# Patient Record
Sex: Male | Born: 1950 | Race: Black or African American | Hispanic: No | Marital: Married | Smoking: Former smoker
Health system: Southern US, Community
[De-identification: ages and names within clinical notes are randomized; demographics above are authoritative.]

## PROBLEM LIST (undated history)

## (undated) DIAGNOSIS — K589 Irritable bowel syndrome without diarrhea: Secondary | ICD-10-CM

## (undated) DIAGNOSIS — C801 Malignant (primary) neoplasm, unspecified: Secondary | ICD-10-CM

## (undated) DIAGNOSIS — H409 Unspecified glaucoma: Secondary | ICD-10-CM

## (undated) DIAGNOSIS — M1990 Unspecified osteoarthritis, unspecified site: Secondary | ICD-10-CM

## (undated) DIAGNOSIS — I739 Peripheral vascular disease, unspecified: Secondary | ICD-10-CM

## (undated) DIAGNOSIS — E119 Type 2 diabetes mellitus without complications: Secondary | ICD-10-CM

## (undated) DIAGNOSIS — I10 Essential (primary) hypertension: Secondary | ICD-10-CM

## (undated) HISTORY — DX: Unspecified osteoarthritis, unspecified site: M19.90

## (undated) HISTORY — DX: Peripheral vascular disease, unspecified: I73.9

## (undated) HISTORY — DX: Essential (primary) hypertension: I10

## (undated) HISTORY — DX: Type 2 diabetes mellitus without complications: E11.9

## (undated) HISTORY — DX: Irritable bowel syndrome without diarrhea: K58.9

## (undated) HISTORY — DX: Unspecified glaucoma: H40.9

---

## 1998-02-26 ENCOUNTER — Ambulatory Visit (HOSPITAL_COMMUNITY): Admission: RE | Admit: 1998-02-26 | Discharge: 1998-02-26 | Payer: Self-pay | Admitting: Neurological Surgery

## 1998-05-02 ENCOUNTER — Ambulatory Visit (HOSPITAL_COMMUNITY): Admission: RE | Admit: 1998-05-02 | Discharge: 1998-05-02 | Payer: Self-pay | Admitting: Gastroenterology

## 1998-05-28 ENCOUNTER — Ambulatory Visit (HOSPITAL_COMMUNITY): Admission: RE | Admit: 1998-05-28 | Discharge: 1998-05-28 | Payer: Self-pay | Admitting: Neurological Surgery

## 1998-05-28 ENCOUNTER — Encounter: Payer: Self-pay | Admitting: Neurological Surgery

## 1998-09-21 ENCOUNTER — Ambulatory Visit (HOSPITAL_COMMUNITY): Admission: RE | Admit: 1998-09-21 | Discharge: 1998-09-21 | Payer: Self-pay | Admitting: Neurological Surgery

## 1998-09-21 ENCOUNTER — Encounter: Payer: Self-pay | Admitting: Neurological Surgery

## 1998-11-14 ENCOUNTER — Ambulatory Visit (HOSPITAL_COMMUNITY): Admission: RE | Admit: 1998-11-14 | Discharge: 1998-11-14 | Payer: Self-pay | Admitting: Neurological Surgery

## 1998-11-14 ENCOUNTER — Encounter: Payer: Self-pay | Admitting: Neurological Surgery

## 1998-12-28 ENCOUNTER — Encounter: Payer: Self-pay | Admitting: Neurosurgery

## 1999-01-01 ENCOUNTER — Encounter: Payer: Self-pay | Admitting: Neurological Surgery

## 1999-01-01 ENCOUNTER — Inpatient Hospital Stay (HOSPITAL_COMMUNITY): Admission: RE | Admit: 1999-01-01 | Discharge: 1999-01-05 | Payer: Self-pay | Admitting: Neurological Surgery

## 1999-04-18 ENCOUNTER — Ambulatory Visit (HOSPITAL_COMMUNITY): Admission: RE | Admit: 1999-04-18 | Discharge: 1999-04-18 | Payer: Self-pay | Admitting: Gastroenterology

## 1999-11-07 ENCOUNTER — Encounter: Payer: Self-pay | Admitting: Neurological Surgery

## 1999-11-07 ENCOUNTER — Encounter: Admission: RE | Admit: 1999-11-07 | Discharge: 1999-11-07 | Payer: Self-pay | Admitting: Neurological Surgery

## 2000-03-11 ENCOUNTER — Encounter: Payer: Self-pay | Admitting: Neurological Surgery

## 2000-03-11 ENCOUNTER — Encounter: Admission: RE | Admit: 2000-03-11 | Discharge: 2000-03-11 | Payer: Self-pay | Admitting: Neurological Surgery

## 2000-05-28 ENCOUNTER — Ambulatory Visit (HOSPITAL_BASED_OUTPATIENT_CLINIC_OR_DEPARTMENT_OTHER): Admission: RE | Admit: 2000-05-28 | Discharge: 2000-05-28 | Payer: Self-pay | Admitting: Ophthalmology

## 2002-09-11 ENCOUNTER — Emergency Department (HOSPITAL_COMMUNITY): Admission: EM | Admit: 2002-09-11 | Discharge: 2002-09-11 | Payer: Self-pay | Admitting: Emergency Medicine

## 2002-09-11 ENCOUNTER — Encounter: Payer: Self-pay | Admitting: Emergency Medicine

## 2002-09-14 ENCOUNTER — Ambulatory Visit (HOSPITAL_COMMUNITY): Admission: RE | Admit: 2002-09-14 | Discharge: 2002-09-14 | Payer: Self-pay | Admitting: Family Medicine

## 2003-01-24 ENCOUNTER — Encounter: Payer: Self-pay | Admitting: Family Medicine

## 2003-01-24 ENCOUNTER — Ambulatory Visit (HOSPITAL_COMMUNITY): Admission: RE | Admit: 2003-01-24 | Discharge: 2003-01-24 | Payer: Self-pay | Admitting: Family Medicine

## 2003-01-27 ENCOUNTER — Encounter: Payer: Self-pay | Admitting: Family Medicine

## 2003-01-27 ENCOUNTER — Inpatient Hospital Stay (HOSPITAL_COMMUNITY): Admission: EM | Admit: 2003-01-27 | Discharge: 2003-02-04 | Payer: Self-pay | Admitting: Family Medicine

## 2003-02-01 ENCOUNTER — Encounter: Payer: Self-pay | Admitting: Family Medicine

## 2003-05-01 ENCOUNTER — Ambulatory Visit (HOSPITAL_COMMUNITY): Admission: RE | Admit: 2003-05-01 | Discharge: 2003-05-01 | Payer: Self-pay | Admitting: Family Medicine

## 2003-05-01 ENCOUNTER — Encounter: Payer: Self-pay | Admitting: Family Medicine

## 2003-05-16 ENCOUNTER — Encounter: Payer: Self-pay | Admitting: Neurological Surgery

## 2003-05-16 ENCOUNTER — Ambulatory Visit (HOSPITAL_COMMUNITY): Admission: RE | Admit: 2003-05-16 | Discharge: 2003-05-16 | Payer: Self-pay | Admitting: Neurological Surgery

## 2003-06-12 ENCOUNTER — Encounter: Admission: RE | Admit: 2003-06-12 | Discharge: 2003-06-12 | Payer: Self-pay | Admitting: Neurological Surgery

## 2003-06-12 ENCOUNTER — Encounter: Payer: Self-pay | Admitting: Neurological Surgery

## 2003-09-02 HISTORY — PX: BACK SURGERY: SHX140

## 2003-11-07 ENCOUNTER — Emergency Department (HOSPITAL_COMMUNITY): Admission: EM | Admit: 2003-11-07 | Discharge: 2003-11-07 | Payer: Self-pay | Admitting: Emergency Medicine

## 2004-07-18 ENCOUNTER — Ambulatory Visit (HOSPITAL_COMMUNITY): Admission: RE | Admit: 2004-07-18 | Discharge: 2004-07-18 | Payer: Self-pay | Admitting: Neurological Surgery

## 2004-09-05 ENCOUNTER — Encounter (HOSPITAL_COMMUNITY): Admission: RE | Admit: 2004-09-05 | Discharge: 2004-10-05 | Payer: Self-pay | Admitting: Neurological Surgery

## 2004-12-23 ENCOUNTER — Inpatient Hospital Stay (HOSPITAL_COMMUNITY): Admission: RE | Admit: 2004-12-23 | Discharge: 2004-12-24 | Payer: Self-pay | Admitting: Neurological Surgery

## 2005-01-08 ENCOUNTER — Encounter (INDEPENDENT_AMBULATORY_CARE_PROVIDER_SITE_OTHER): Payer: Self-pay | Admitting: Specialist

## 2005-01-08 ENCOUNTER — Ambulatory Visit (HOSPITAL_COMMUNITY): Admission: RE | Admit: 2005-01-08 | Discharge: 2005-01-08 | Payer: Self-pay | Admitting: Gastroenterology

## 2005-05-29 ENCOUNTER — Inpatient Hospital Stay (HOSPITAL_COMMUNITY): Admission: RE | Admit: 2005-05-29 | Discharge: 2005-05-31 | Payer: Self-pay | Admitting: Gastroenterology

## 2005-06-03 ENCOUNTER — Ambulatory Visit (HOSPITAL_COMMUNITY): Admission: RE | Admit: 2005-06-03 | Discharge: 2005-06-03 | Payer: Self-pay | Admitting: Family Medicine

## 2006-12-16 ENCOUNTER — Ambulatory Visit (HOSPITAL_BASED_OUTPATIENT_CLINIC_OR_DEPARTMENT_OTHER): Admission: RE | Admit: 2006-12-16 | Discharge: 2006-12-16 | Payer: Self-pay | Admitting: Ophthalmology

## 2007-02-24 ENCOUNTER — Ambulatory Visit (HOSPITAL_COMMUNITY): Admission: RE | Admit: 2007-02-24 | Discharge: 2007-02-24 | Payer: Self-pay | Admitting: Gastroenterology

## 2007-03-09 ENCOUNTER — Ambulatory Visit (HOSPITAL_COMMUNITY): Admission: RE | Admit: 2007-03-09 | Discharge: 2007-03-09 | Payer: Self-pay | Admitting: Gastroenterology

## 2007-04-29 ENCOUNTER — Encounter: Admission: RE | Admit: 2007-04-29 | Discharge: 2007-04-29 | Payer: Self-pay | Admitting: Family Medicine

## 2007-08-11 ENCOUNTER — Encounter: Admission: RE | Admit: 2007-08-11 | Discharge: 2007-08-11 | Payer: Self-pay | Admitting: Family Medicine

## 2007-12-13 ENCOUNTER — Ambulatory Visit (HOSPITAL_COMMUNITY): Admission: RE | Admit: 2007-12-13 | Discharge: 2007-12-13 | Payer: Self-pay | Admitting: Family Medicine

## 2008-02-24 ENCOUNTER — Ambulatory Visit: Payer: Self-pay | Admitting: *Deleted

## 2008-03-09 ENCOUNTER — Ambulatory Visit: Payer: Self-pay | Admitting: Surgery

## 2008-03-09 ENCOUNTER — Ambulatory Visit (HOSPITAL_COMMUNITY): Admission: RE | Admit: 2008-03-09 | Discharge: 2008-03-09 | Payer: Self-pay | Admitting: Surgery

## 2008-03-23 ENCOUNTER — Ambulatory Visit: Payer: Self-pay | Admitting: *Deleted

## 2008-04-07 ENCOUNTER — Inpatient Hospital Stay (HOSPITAL_COMMUNITY): Admission: RE | Admit: 2008-04-07 | Discharge: 2008-04-14 | Payer: Self-pay | Admitting: *Deleted

## 2008-04-07 ENCOUNTER — Ambulatory Visit: Payer: Self-pay | Admitting: *Deleted

## 2008-04-07 DIAGNOSIS — M1990 Unspecified osteoarthritis, unspecified site: Secondary | ICD-10-CM

## 2008-04-07 DIAGNOSIS — K589 Irritable bowel syndrome without diarrhea: Secondary | ICD-10-CM

## 2008-04-07 HISTORY — PX: FEMORAL-TIBIAL BYPASS GRAFT: SHX938

## 2008-04-07 HISTORY — DX: Irritable bowel syndrome without diarrhea: K58.9

## 2008-04-07 HISTORY — DX: Unspecified osteoarthritis, unspecified site: M19.90

## 2008-04-08 ENCOUNTER — Encounter (INDEPENDENT_AMBULATORY_CARE_PROVIDER_SITE_OTHER): Payer: Self-pay | Admitting: *Deleted

## 2008-04-21 ENCOUNTER — Ambulatory Visit: Payer: Self-pay | Admitting: Vascular Surgery

## 2008-05-04 ENCOUNTER — Ambulatory Visit: Payer: Self-pay | Admitting: *Deleted

## 2008-08-03 ENCOUNTER — Ambulatory Visit: Payer: Self-pay | Admitting: *Deleted

## 2008-12-06 ENCOUNTER — Ambulatory Visit: Payer: Self-pay | Admitting: *Deleted

## 2009-04-03 ENCOUNTER — Ambulatory Visit: Payer: Self-pay | Admitting: *Deleted

## 2009-12-07 ENCOUNTER — Emergency Department (HOSPITAL_COMMUNITY): Admission: EM | Admit: 2009-12-07 | Discharge: 2009-12-07 | Payer: Self-pay | Admitting: Emergency Medicine

## 2010-02-13 ENCOUNTER — Encounter (HOSPITAL_COMMUNITY): Admission: RE | Admit: 2010-02-13 | Discharge: 2010-03-15 | Payer: Self-pay | Admitting: Family Medicine

## 2010-03-01 ENCOUNTER — Ambulatory Visit (HOSPITAL_COMMUNITY): Admission: RE | Admit: 2010-03-01 | Discharge: 2010-03-01 | Payer: Self-pay | Admitting: Family Medicine

## 2010-09-22 ENCOUNTER — Encounter: Payer: Self-pay | Admitting: Gastroenterology

## 2011-01-11 HISTORY — PX: COLONOSCOPY: SHX174

## 2011-01-14 NOTE — Procedures (Signed)
BYPASS GRAFT EVALUATION   INDICATION:  Followup evaluation of lower extremity bypass graft.  The  patient complains of right leg numbness and left leg burning.   HISTORY:  Diabetes:  Yes.  Cardiac:  No.  Hypertension:  Yes.  Smoking:  The patient currently smokes.  Previous Surgery:  Left superficial femoral artery to anterior tibial  artery bypass graft with nonreverse translocated saphenous vein on  04/07/2008.   SINGLE LEVEL ARTERIAL EXAM                               RIGHT              LEFT  Brachial:                    139                140  Anterior tibial:             137                86  Posterior tibial:            119                88  Peroneal:  Ankle/brachial index:        0.98               0.63   PREVIOUS ABI:  Date:  04/08/2008  RIGHT:  1.0  LEFT:  0.80   LOWER EXTREMITY BYPASS GRAFT DUPLEX EXAM:   DUPLEX:  Due to the drop in the left ABI and pain in the left leg the  decision was made to duplex the bypass graft.  Doppler arterial  waveforms are monophasic proximal to, within and distal to the left  superficial femoral artery to anterior tibial artery bypass graft with  no evidence of vein graft stenosis.   IMPRESSION:  1. Right ABI is stable from postop study.  2. Left ABI is lower than previously recorded.  3. The superficial femoral artery to anterior tibial artery bypass      graft is patent with no evidence of vein graft stenosis.   ___________________________________________  P. Liliane Bade, M.D.   MC/MEDQ  D:  05/05/2008  T:  05/05/2008  Job:  [id number]

## 2011-01-14 NOTE — Procedures (Signed)
BYPASS GRAFT EVALUATION   INDICATION:  Followup lower extremity bypass graft.   HISTORY:  Diabetes:  Yes.  Cardiac:  No.  Hypertension:  Yes.  Smoking:  Yes.  Previous Surgery:  Left SFA to ATA bypass graft with nonreversed  translocated saphenous vein on 04/07/2008 by Dr. Madilyn Fireman.   SINGLE LEVEL ARTERIAL EXAM                               RIGHT              LEFT  Brachial:                    144                144  Anterior tibial:             133                117  Posterior tibial:            111                101  Peroneal:  Ankle/brachial index:        0.92               0.81   PREVIOUS ABI:  Date:  08/03/2008  RIGHT:  0.94  LEFT:  0.78   LOWER EXTREMITY BYPASS GRAFT DUPLEX EXAM:   DUPLEX:  Patent SFA to ATA bypass graft with no evidence of focal  stenosis.  Biphasic duplex waveform noted within graft and native arteries.   IMPRESSION:  1. Right lower extremity ABI suggests mild arterial disease with      biphasic and monophasic Doppler waveforms.  2. Left lower extremity ABI suggests moderate arterial disease with      biphasic and monophasic Doppler waveforms.   ___________________________________________  P. Liliane Bade, M.D.   AC/MEDQ  D:  12/06/2008  T:  12/06/2008  Job:  [id number]

## 2011-01-14 NOTE — Consult Note (Signed)
VASCULAR SURGERY CONSULTATION   [name], [name]  DOB:  01/21/1951                                       02/24/2008  ZOXWR#:[id number]   REFERRING PHYSICIAN:  Renaye [name], M.D.   CHIEF COMPLAINT:  Left lower extremity pain.   HISTORY:  The patient is a 60 year old gentleman who notes left calf  discomfort with ambulation.  This occurs at less than one block.  There  is relief readily with rest.  He denies night pain or rest pain.  The  pain is made worse by brisk walking or up on incline.  No pain in the  right lower extremity.  No history of nonhealing lesions.   PAST MEDICAL HISTORY:  1. Type 2 diabetes.  2. Hypertension.  3. Degenerative disk disease.  4. Irritable bowel syndrome.  5. Glaucoma.   MEDICATIONS:  1. Plavix 75 mg daily.  2. Azor 10/40 one tablet daily.  3. Viagra p.r.n.  4. Bupropion SR 150 mg tablet b.i.d.  5. Bystolic 10 mg daily.  6. Tekturna HCT 300/25 one tablet daily.  7. Timolol 1 drop each eye daily.  8. Brimonidine 0.2% 1 drop each eye t.i.d.   ALLERGIES:  None known.   FAMILY HISTORY:  Mother died age 31 with a history of congestive heart  failure, diabetes and coronary artery disease.  Father died age 45 with  a history of coronary artery disease and had undergone bypass surgery.  He has several siblings with a history of diabetes and strokes.   SOCIAL HISTORY:  The patient is married and two children.  He is on long  term disability.  Smokes heavily up to two packs of cigarettes per day  but has now reduced this to 6 cigarettes daily.  No alcohol use.   REVIEW OF SYSTEMS:  The patient notes abdominal pain related to  irritable bowel syndrome.  Muscle pain and arthritis.  Further systems  negative per patient encounter form.   PHYSICAL EXAMINATION:  General:  Alert 60 year old [place]  male.  No acute distress.  Vital signs:  BP 120/72, pulse 81 per minute,  respirations 16 per minute.  HEENT:  Mouth and throat  are clear.  Normocephalic.  Extraocular movements intact.  Neck:  Supple.  No  thyromegaly or adenopathy.  Chest:  Equal air entry bilaterally.  No  rales or rhonchi.  Cardiovascular:  No carotid bruits.  Normal heart  sounds without murmurs.  Regular rate and rhythm.  No gallops or rubs.  Abdomen:  Soft, nontender.  No masses or organomegaly.  Normal bowel  sounds, no bruits.  Lower extremities:  No femoral bruits.  2+ femoral  pulses bilaterally.  1+ right popliteal and dorsalis pedis pulse.  Absent left popliteal, dorsalis pedis and posterior tibial pulses.   INVESTIGATIONS:  Lower extremity Doppler reveals left ABI of 0.73 and  right ABI of 0.40.  Waveforms consistent with inflow disease bilaterally  and bilateral superficial femoral artery occlusive disease.   IMPRESSION:  1. Left lower extremity claudication.  2. Type 2 diabetes.  3. Hypertension.  4. Irritable bowel syndrome.  5. Glaucoma.  6. Tobacco abuse.   RECOMMENDATIONS:  The patient will be scheduled to undergo lower  extremity arteriography with possible intervention for treatment of his  lower extremity claudication.  This will be scheduled at [facility] as  an outpatient.   [name], M.D.  Electronically Signed  PGH/MEDQ  D:  02/24/2008  T:  02/25/2008  Job:  [id number]   cc:   [name], M.D.

## 2011-01-14 NOTE — Procedures (Signed)
VASCULAR LAB EXAM   INDICATION:  Preop exam.   HISTORY:  Diabetes:  Yes.  Cardiac:  Yes.  Hypertension:  Yes.   EXAM:  Bilateral lower extremity vein mapping.   IMPRESSION:  1. The right greater saphenous pain is compressible with diameter      measurements ranging from 0.28 to 0.45 cm.  2. The right lesser saphenous vein is compressible with diameter      measurements ranging from 0.25 to 0.35 cm.  3. The left greater saphenous vein is compressible with diameter      measurements ranging from 0.16 to 0.3 cm.  4. The left lesser saphenous vein was not visualized.  5. Additional measurements are noted on the attached work sheet.   ___________________________________________  P. Liliane Bade, M.D.   CH/MEDQ  D:  03/23/2008  T:  03/23/2008  Job:  [id number]

## 2011-01-14 NOTE — H&P (Signed)
[name], [name]              ACCOUNT NO.:  [id number]   MEDICAL RECORD NO.:  [id number]          PATIENT TYPE:  INP   LOCATION:  3304                         FACILITY:  [facility]   PHYSICIAN:  Balinda Quails, M.D.    DATE OF BIRTH:  [birth year]   DATE OF ADMISSION:  04/07/2008  DATE OF DISCHARGE:                              HISTORY & PHYSICAL   ADMITTING PHYSICIAN:  Balinda Quails, MD   PRIMARY CARE PHYSICIAN:  Renaye Rakers, MD   ADMISSION DIAGNOSIS:  Ischemic left foot.   HISTORY:  [name] is a 60 year old gentleman with a history of  severe claudication.  Now, he has developed rest pain in his left foot.  The pain has continued to worsen over the past several weeks.  No  history of nonhealing ulceration or gangrene.  He does have underlying  type 2 diabetes.   An arteriogram was performed and this reveals distal left superficial  femoral and popliteal occlusion and severe tibial disease with dominant  runoff anterior tibial artery.   PAST MEDICAL HISTORY:  1. Type 2 diabetes.  2. Hypertension.  3. Degenerative disk disease.  4. Irritable bowel syndrome.  5. Glaucoma.   MEDICATIONS:  1. Plavix 75 mg daily.  2. Azor 10/40 one tablet daily.  3. Bystolic 10 mg daily.  4. Bupropion SR 150 mg b.i.d.  5. Viagra p.r.n.  6. Tekturna HCT 300/25 one tablet daily.  7. Atenolol 0.5% one drop each eye daily.  8. Brimonidine 0.2% one drop each eye t.i.d.  9. Darvocet-N 100 1 to 2 tablets q.4 h. p.r.n.   ALLERGIES:  None known.   FAMILY HISTORY:  Mother died at age 48 with a history of congestive  heart failure, diabetes, and coronary disease.  Father died at age 66  with a history of coronary disease and had undergone bypass surgery.  He  has several siblings with a history of diabetes and strokes.   SOCIAL HISTORY:  The patient is married with two children.  He is on  long-term disability.  Smokes heavily up to 2 packs of cigarettes daily,  but has now reduced this to  approximately 6 cigarettes daily.  No  alcohol use.   REVIEW OF SYSTEMS:  The patient notes abdominal pain related to  irritable bowel syndrome.  Muscle pain and arthritis.   PHYSICAL EXAMINATION:  GENERAL:  Alert 60 year old [place]  male.  In no acute distress.  VITAL SIGNS:  BP 120/72, pulse 81 per minute, and respirations 16 per  minute.  HEENT:  Mouth and throat are clear.  Normocephalic.  Extraocular  movements are intact.  NECK:  Supple.  No thyromegaly or adenopathy.  CHEST:  Equal air entry bilaterally, no rales or rhonchi.  CARDIOVASCULAR:  No carotid bruits.  Normal heart sounds without  murmurs.  Regular rate and rhythm.  No gallops or rubs.  ABDOMEN:  Soft, nontender.  No masses or organomegaly.  Normal bowel  sounds without bruits.  LOWER EXTREMITIES:  No femoral bruits.  2+ femoral pulses bilaterally.  1+ right popliteal and dorsalis pedis pulse.  Absent left  popliteal,  posterior tibial, and dorsalis pedis pulses.  Left foot is cool.  No  ulceration or gangrene.   ADMISSION DIAGNOSIS:  Ischemic left foot.   SECONDARY DIAGNOSES:  Type 2 diabetes, hypertension, irritable bowel  syndrome, glaucoma, tobacco abuse.   ADMISSION PLAN:  The patient will be admitted electively at [facility] on April 07, 2008, for planned left femoral anterior tibial  bypass for limb salvage.      Balinda Quails, M.D.  Electronically Signed     PGH/MEDQ  D:  04/07/2008  T:  04/08/2008  Job:  [id number]   cc:   Renaye Rakers, M.D.

## 2011-01-14 NOTE — Procedures (Signed)
BYPASS GRAFT EVALUATION   INDICATION:  Follow-up left lower extremity bypass graft.   HISTORY:  Diabetes:  Yes.  Cardiac:  No.  Hypertension:  Yes.  Smoking:  Yes.  Previous Surgery:  Left superficial femoral to anterior tibial artery  bypass graft on 04/07/2008 by Dr. Madilyn Fireman.   SINGLE LEVEL ARTERIAL EXAM                               RIGHT              LEFT  Brachial:                    153                160  Anterior tibial:             152                125  Posterior tibial:            129                125  Peroneal:  Ankle/brachial index:        0.95               0.78   PREVIOUS ABI:  Date: 12/06/2008  RIGHT:  0.92  LEFT:  0.81   LOWER EXTREMITY BYPASS GRAFT DUPLEX EXAM:   DUPLEX:  1. Biphasic Doppler waveforms noted throughout the left lower      extremity bypass graft and its native vessels.  2. Mildly increased velocity of 146 cm/s noted at the left distal      anastomosis region which could be due to change in vessel diameter.   IMPRESSION:  1. Patent left superficial femoral to anterior tibial artery bypass      graft with increased velocity noted, as described above.  2. Stable bilateral ankle brachial indices noted.   ___________________________________________  Di Kindle. Edilia Bo, M.D.   CH/MEDQ  D:  04/03/2009  T:  04/03/2009  Job:  [id number]

## 2011-01-14 NOTE — Op Note (Signed)
NAMEMARVIN, [name]              ACCOUNT NO.:  [id number]   MEDICAL RECORD NO.:  [id number]          PATIENT TYPE:  AMB   LOCATION:  SDS                          FACILITY:  [facility]   PHYSICIAN:  Juleen China IV, MDDATE OF BIRTH:  [birth year]   DATE OF PROCEDURE:  03/09/2008  DATE OF DISCHARGE:  03/09/2008                               OPERATIVE REPORT   PREOPERATIVE DIAGNOSIS:  Left leg claudication.   POSTOPERATIVE DIAGNOSIS:  Left leg claudication.   PROCEDURE PERFORMED:  1. Ultrasound-guided access of right common femoral artery.  2. Abdominal aortogram.  3. Bilateral lower extremity runoff.  4. Second-order catheterization.   PROCEDURE:  The patient identified in the holding area and taken to room  #8.  He was placed supine on the table.  Bilateral groins were prepped  and draped in a standard sterile fashion.  Time-out was called.  Right  common femoral artery was evaluated with ultrasound and found to be  widely patent.  Lidocaine 1% was used for local anesthesia.  The right  common femoral artery was accessed with an 18-gauge needle under  ultrasound guidance.  A Benson wire was advanced in a retrograde fashion  into the aorta under fluoroscopic visualization and a 5-French sheath  was placed.  Next, an Omni Flush catheter was placed at level L1 and  abdominal aortogram was obtained.  Next, the right leg runoff was  performed by injection to the sheath.  Next using, Omni Flush catheter  and Benson wire, the aortic bifurcation was crossed.  The catheter was  placed in the left external iliac artery and the left lower extremity  runoff was obtained.   FINDINGS:  Aortogram:  The visualized portions of the suprarenal  abdominal aorta shows minimal disease.  There are single renal arteries  bilaterally, which are widely patent.  The infrarenal abdominal aorta  shows minimal disease.  Bilateral common iliac arteries are widely  patent.  There is mild diffuse disease within  bilateral external iliac  arteries.   Right Lower Extremity:  The right common femoral artery is widely  patent.  The right superficial femoral artery is patent.  There is mild  disease within the adductor canal.  The profunda femoral artery is  widely patent.  The popliteal artery is patent.  The anterior tibial  artery appears to be dominant runoff on the right; however, limited  evaluation of the right-sided tibial disease was obtained secondary to  the patient's movement.   Left Lower Extremity:  The left common femoral artery is widely patent.  The left profunda femoral artery is widely patent.  The left superficial  femoral artery is patent.  There is mild amount of disease within the  adductor canal.  The popliteal artery has moderate disease.  It is  occluded at the level of the patella.  There are prominent genicular  collaterals in this region.  There is reconstitution of the anterior  tibial artery, which is patent down across the ankle.  The peroneal  artery also reconstitutes and it is patent down to the level of just  above the ankle.  Posterior tibial artery is occluded.   After the above images were obtained, decision was made to terminate the  procedure.  Catheters and wires were removed.  The patient was taken to  the holding area for sheath pull.   IMPRESSION:  1. Mild-to-moderate diffuse disease at bilateral superficial femoral      arteries and the adductor canal.  2. Occlusion of the left popliteal artery and reconstitution of      anterior tibial artery, which is diseased.           ______________________________  V. Charlena Cross, MD  Electronically Signed     VWB/MEDQ  D:  03/09/2008  T:  03/10/2008  Job:  [id number]

## 2011-01-14 NOTE — Op Note (Signed)
[name], [name]              ACCOUNT NO.:  [id number]   MEDICAL RECORD NO.:  [id number]          PATIENT TYPE:  AMB   LOCATION:  ENDO                         FACILITY:  [facility]   PHYSICIAN:  Anselmo Rod, M.D.  DATE OF BIRTH:  12-04-1950   DATE OF PROCEDURE:  02/24/2007  DATE OF DISCHARGE:  02/24/2007                               OPERATIVE REPORT   PROCEDURE:  Esophagogastroduodenoscopy.   PROCEDURE PERFORMED:  Esophagogastroduodenoscopy.   ENDOSCOPIST:  Dr. Anselmo Rod.   INSTRUMENT USED:  Pentax video panendoscope.   INDICATIONS FOR PROCEDURE:  A 60 year old, white male with a history of  abdominal pain especially in the epigastric and periumbilical area  undergoing EGD to rule out peptic ulcer disease, esophagitis, gastritis,  etc.   PREPROCEDURE PREPARATION:  Informed consent was procured from the  patient.  The patient fasted for 8 hours prior to procedure. The risks  and benefits of the procedure were discussed with the patient.   PREPROCEDURE PHYSICAL:  The patient had stable vital signs.  Neck  supple.  Chest clear to auscultation.  S1, S2 regular.  Abdomen soft  with normal bowel sounds.   DESCRIPTION OF PROCEDURE:  The patient was placed in the left lateral  decubitus position and sedated with 60 mcg of Fentanyl and 6 mg of  Versed given intravenously in slow incremental doses. Once the patient  was adequately sedated and maintained on low-flow oxygen and continuous  cardiac monitoring, the Pentax video panendoscope was advanced through  the mouthpiece over the tongue into the esophagus under direct vision.  The entire esophagus appeared widely patent with no evidence of ring,  stricture, mass, esophagitis or Barrett's mucosa.  The scope was then  advanced into the stomach.  A small hiatal hernia was seen on high  retroflexion.  The rest of the gastric mucosa in the proximal small  bowel appeared normal.  There was no obstruction. No ulcers, erosions,  masses or polyps were seen.   IMPRESSION:  Normal EGD except for small hiatal hernia.  No ulcers,  erosions, masses or polyps seen.   RECOMMENDATIONS:  1. Continue proton pump inhibitor.  2. Outpatient follow-up in the next 2 weeks, earlier if necessary.      Anselmo Rod, M.D.  Electronically Signed     JNM/MEDQ  D:  03/02/2007  T:  03/03/2007  Job:  [id number]   cc:   Renaye Rakers, M.D.  Fax: [phone]

## 2011-01-14 NOTE — Op Note (Signed)
[name], [name]              ACCOUNT NO.:  [id number]   MEDICAL RECORD NO.:  [id number]          PATIENT TYPE:  INP   LOCATION:  3304                         FACILITY:  [facility]   PHYSICIAN:  Balinda Quails, M.D.    DATE OF BIRTH:  February 23, 1951   DATE OF PROCEDURE:  04/07/2008  DATE OF DISCHARGE:                               OPERATIVE REPORT   SURGEON:  Balinda Quails, MD   ASSISTANT:  Jerold Coombe, PA.   ANESTHETIC:  General endotracheal.   ANESTHESIOLOGIST:  Maren Beach, MD   PREOPERATIVE DIAGNOSIS:  Ischemic rest pain, left foot.   POSTOPERATIVE DIAGNOSIS:  Ischemic rest pain, left foot.   PROCEDURE:  1. Left superficial femoral artery to anterior tibial bypass with non      reversed translocated saphenous vein graft.  2. Intraoperative arteriogram.   CLINICAL NOTE:  [name] is a 60 year old diabetic male with  advanced peripheral vascular disease.  He has ischemic rest pain in the  left foot.  Previous arteriography has revealed a popliteal occlusion  with reconstitution of the anterior tibial artery as the main runoff  vessel to the left foot.  He is brought to the operating at this time  for planned left femoral-anterior tibial bypass.   OPERATIVE PROCEDURE:  The patient is brought to the operating room in  stable hemodynamic condition.  He is placed under general endotracheal  anesthesia.  Left leg prepped and draped in a sterile fashion.   An oblique skin incision made in left groin.  Dissection carried down  through subcutaneous tissue.  The left femoral vessels were exposed.  Left common femoral artery exposed down to the origin of the superficial  femoral artery.  Left superficial femoral artery was patent.  This was  mobilized proximally and encircled with a vessel loop.   The saphenofemoral junction exposed, and the saphenous vein dissected  out.  Tributaries were ligated with 3-0 silk and divided.  Distal  dissection carried along the  saphenofemoral junction down and a  longitudinal skin incision made throughout the length of the left leg.  Saphenous vein exposed, ligated 3-0 silk, and divided.  Distal  dissection carried down to the popliteal fossa.  The left popliteal  fossa entered medially.  The trifurcation exposed.  The proximal left  anterior tibial artery was patent.  This was dissected out and encircled  with a vessel loop.   The saphenous vein was then harvested from the left leg.  Dilated with  heparin saline solution.  The vein then beveled proximally and distally.  A subsartorial tunnel made between the 2 incisions and a tunneler was  passed through the tunnel.   The femoral vessels controlled clamps.  A longitudinal arteriotomy made  in the distal common femoral artery.  The arteriotomy extended across  the left common femoral artery.  The vein was beveled and anastomosed  end-to-side to the left common femoral artery using running 6-0 Prolene  suture.  The valvulotome was then passed up the vein.  Valves were  divided.  Excellent flow present.  The vein then tunneled in  the non-  reversed fashion down to the popliteal fossa.  The left anterior tibial  artery is controlled proximally this with bulldog clamps.  Longitudinal  arteriotomy made.  The vein beveled and anastomosed end-to-side to the  anterior tibial artery using running 7-0 Prolene suture.  Flushing  carried out.  Clamps were removed.  Excellent flow present.  Adequate  hemostasis obtained.   Intraoperative arteriogram obtained with injection of 20 mL of contrast  solution.  This revealed patent vein with flow through the anterior  tibial artery to the foot.   The patient administered 50 mg of protamine intravenously.  Adequate  hemostasis was obtained.  Sponge and instrument counts correct.   The incisions were then closed with a deep layer of running 2-0 Vicryl  suture and superficial layer of running 3-0 Vicryl suture.  Staples   applied to skin.  Sterile dressings applied.   The patient tolerated the procedure well.  He was transferred to  recovery room in stable condition.      Balinda Quails, M.D.  Electronically Signed     PGH/MEDQ  D:  04/07/2008  T:  04/08/2008  Job:  [id number]

## 2011-01-14 NOTE — Discharge Summary (Signed)
[name], [name]              ACCOUNT NO.:  [id number]   MEDICAL RECORD NO.:  [id number]          PATIENT TYPE:  INP   LOCATION:  2021                         FACILITY:  [facility]   PHYSICIAN:  Balinda Quails, M.D.    DATE OF BIRTH:  1951/01/01   DATE OF ADMISSION:  04/07/2008  DATE OF DISCHARGE:  04/14/2008                               DISCHARGE SUMMARY   ADMISSION DIAGNOSIS:  Ischemic left foot.   DISCHARGE/SECONDARY DIAGNOSES:  Ischemic rest pain, left foot status  post left superficial femoral artery to anterior tibial artery bypass.  1. Mild postoperative hypotension.  2. Postoperative dysuria with negative urinalysis.  3. Underlying hypertension.  4. Diabetes mellitus type 2, diet controlled.  5. Degenerative disk disease.  6. Irritable bowel syndrome.  7. Glaucoma Coma.  8. Seen Dr. Brunilda Payor in the past for believed benign prostatic      hypertrophy.  9. Erectile dysfunction.  10.Ongoing tobacco abuse.   No known drug allergies.   PROCEDURES:  On April 07, 2008, left superficial femoral artery to  anterior tibial artery bypass with nonreverse translocated saphenous  vein graft with intraoperative arteriogram by Dr. Madilyn Fireman.   BRIEF HISTORY:  [name] is a 61 year old black male with diabetes  and advanced peripheral vascular disease.  He has ischemic rest pain in  the left foot.  Previous arteriography revealed popliteal occlusion with  reconstitution of the anterior tibial artery as the main runoff vessels  to the left foot.  He was seen by Dr. Madilyn Fireman who recommended left femoral  to anterior tibial artery bypass.   HOSPITAL COURSE:  [name] was electively admitted to [facility] on April 07, 2008.  He underwent the previously-mentioned  procedure.  Postoperatively, he was initially sent to step-down unit  overnight.  He remained stable in the morning of postoperative day #1  and transferred to telemetry unit 2000 where he remained until  discharge.  He  did have postoperative fevers in the first few days of  surgery, spiking this up to 101.5.  This was felt most likely due to  atelectasis.  Aggressive pulmonary toilet was encouraged.  We also  followed his white count, which was normal.  Fevers had resolved by  discharge.  Because he had also reported some dysuria, we also checked  the urinalysis, which was normal and felt most likely due to  inflammation of his prostate secondary to the Foley catheter.  As the  patient did report he had seen Dr. Brunilda Payor in the past, he was unclear for  the reason, but thought that it was due to BPH.  He was able to void  adequately at discharge and felt that he was emptying his bladder and  denied urinary frequency or urgency or hesitancy.  We also had to adjust  his antihypertensive medications during his hospitalization as his blood  pressure was running in the high 80s to low 100s.  We did hold his  Tekturna/HCT and put parameters on his other antihypertensives.  His  postoperative ABIs were 1.0 on the right and 0.8 on the left.  He  maintained good Doppler signals.  His incisions appeared to be healing  well without signs of infection.  He did have small amount of  serosanguineous at his above the knee incision site.  Again, the  incision did not look infected and we prescribed a dry dressing changes  daily as needed and instructed to call if any purulent drainage or  erythema developed.  Of note, [name] did undergo tobacco cessation  consultation during his hospitalization and says that he plans to quit  smoking.  On April 14, 2008, [name] was felt appropriate for  discharge.  He reported that he was more comfortable.  His foot remained  well perfused and vitals were stable showing blood pressure 107/64,  temperature 98.3, pulse 72, in sinus rhythm, and room air oxygenation  96%.  We did place him on a NovoLog insulin sliding scale for his  diabetes and sugars had good control over the left  24 hours, ranging  between 100 and 120.  His hemoglobin A1c this admission was 5.6.  Other  labs as of April 12, 2008, showed a white count of 6.1, hemoglobin 9.4,  hematocrit 27.6, and platelet count 202.  Sodium 134, potassium 4.0,  chloride 102, CO2 25, blood glucose 107, BUN 15, creatinine 0.95, and  calcium 9.8.  His preoperative liver function tests were normal and  again his urinalysis on April 10, 2008, was negative.  At the time of  dictation, [name] remained stable and improved at time of this  dictation.   DISCHARGE MEDICATIONS:  1. Discharged with Tylox 1-2 tablets p.o. q.4 h. p.r.n.  2. Bystolic 10 mg p.o. daily.  3. Plavix 75 mg p.o. daily.  4. Aspirin 81 mg p.o. daily.  5. Wellbutrin 150 mg SR 1 tablet p.o. b.i.d.  6. Timoptic 0.5% ophthalmic solution in both eyes daily.  7. He is instructed to hold his Tekturna/HCT 300 mg/25 mg p.o. daily      until his followup with primary physician.  8. Alphagan 0.2% ophthalmic solution both eyes b.i.d.  9. Bentyl 10 mg p.o. q.i.d.  10.Ocuflox 0.3% ophthalmic solution in both eyes q.i.d.  11.Azor 10/40 mg p.o. daily.  12.He was instructed to hold his home Darvocet while taking Tylox for      pain.   DISCHARGE INSTRUCTIONS:  He is to continue a diabetic-appropriate diet.  He may shower and clean incisions gently with soap and water.  Call if  he develops fever greater than 101 or signs of infection of his wound  site or increased pain.  He should avoid driving or heavy lifting for  the next 2-3 weeks and increase activities slowly.  He will need a  staple removal appointment in our office in 1 week and see Dr. Madilyn Fireman in  approximately 3 weeks with ABIs.  Our office will also contact him  regarding specific appointment date and time.  He was instructed to make  an appointment with his family physician, Dr. Parke Simmers within the next  month to reevaluate his blood pressure and antihypertensive regimen.      Jerold Coombe,  P.A.      Balinda Quails, M.D.  Electronically Signed    AWZ/MEDQ  D:  04/14/2008  T:  04/14/2008  Job:  [id number]   cc:   Renaye Rakers, M.D.

## 2011-01-14 NOTE — Assessment & Plan Note (Signed)
OFFICE VISIT   AUGUSTIN, BUN T  DOB:  May 11, 1951                                       03/23/2008  [id number]#:[id number]   The patient has severe limiting claudication of the left lower extremity  and now has developed left foot rest pain.  His arteriogram revealed a  left popliteal occlusion with reconstitution of the anterior tibial  artery.   He will require left femoral anterior tibial bypass for relief of  ischemic symptoms.  Early rest pain.   His blood pressure at this time 101/65, pulse 93 per minute.  His left  foot is cool without acute ischemia.  No ischemic lesions.   Duplex scan reveals a small saphenous vein on the left and moderate size  on the right.  May require bilateral greater saphenous vein harvesting  for bypass.   Surgery is scheduled for 04/07/2008 at [facility].   Balinda Quails, M.D.  Electronically Signed   PGH/MEDQ  D:  03/23/2008  T:  03/24/2008  Job:  [id number]   cc:   Renaye Rakers, M.D.

## 2011-01-14 NOTE — Assessment & Plan Note (Signed)
OFFICE VISIT   [name], [name]  DOB:  1950/12/05                                       05/04/2008  MWNUU#:[id number]   The patient underwent left superficial femoral anterior tibial bypass  with saphenous vein graft on 04/07/2008.  He continues to complain of  some claudication symptoms in his left leg.  This was carried out for  limb salvage due to rest pain.  His left ABI is now 0.63 with a patent  left femoral anterior tibial bypass graft.  Duplex evaluation of his  left leg vein graft reveals it to be widely patent without stenosis.   Blood pressure 142/88, pulse 93 per minute.   The patient does continue to complain of some pain in his foot.  This  seems to be neuropathic in nature with some burning especially at night.  I have placed him on Neurontin 300 mg daily at bedtime.  Also renewed a  prescription for Percocet 5/325 x30 tablets.   Balinda Quails, M.D.  Electronically Signed   PGH/MEDQ  D:  05/04/2008  T:  05/06/2008  Job:  [id number]

## 2011-01-14 NOTE — Procedures (Signed)
BYPASS GRAFT EVALUATION   INDICATION:  Followup left lower extremity bypass graft with  intermittent burning.  Claudication is greatly improved postop per  the patient.   HISTORY:  Diabetes:  Yes.  Cardiac:  No.  Hypertension:  Yes.  Smoking:  Yes.  Previous Surgery:  Left SFA to ATA bypass graft with nonreverse  translocated saphenous vein 04/07/2008.   SINGLE LEVEL ARTERIAL EXAM                               RIGHT              LEFT  Brachial:                    112                112  Anterior tibial:             105                81  Posterior tibial:            91                 87  Peroneal:  Ankle/brachial index:        0.94               0.78   PREVIOUS ABI:  Date:  05/04/2008  RIGHT:  0.98  LEFT:  0.63   LOWER EXTREMITY BYPASS GRAFT DUPLEX EXAM:   DUPLEX:  Doppler arterial waveforms appear biphasic proximal to and  within the bypass graft.  Monophasic distal to it.   IMPRESSION:  1. Patent left superficial femoral artery to anterior tibial artery      bypass graft.  2. Bilateral ABIs appear fairly stable from previous studies.   ___________________________________________  P. Liliane Bade, M.D.   AS/MEDQ  D:  08/03/2008  T:  08/03/2008  Job:  [id number]
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
## 2011-01-17 NOTE — Discharge Summary (Signed)
[name], [name]              ACCOUNT NO.:  [id number]   MEDICAL RECORD NO.:  [id number]          PATIENT TYPE:  INP   LOCATION:  5714                         FACILITY:  [facility]   PHYSICIAN:  Hettie Holstein, D.O.    DATE OF BIRTH:  [birth year]   DATE OF ADMISSION:  05/29/2005  DATE OF DISCHARGE:  05/31/2005                                 DISCHARGE SUMMARY   PRIMARY CARE PHYSICIAN:  Renaye Rakers, M.D.   GASTROENTEROLOGIST:  Anselmo Rod, M.D.   ADMISSION DIAGNOSIS:  Abdominal pain.   DISCHARGE DIAGNOSES:  1.  Continued abdominal pain, status post evaluation this admission by      Anselmo Rod, M.D., with normal upper endoscopy on May 30, 2005, and CT scan performed without evidence of acute findings.  There      was a small left incidentally noted renal cyst.  Etiology of abdominal      pain unclear at this time.  We have set [name] up for outpatient      HIDA scan to rule out gallbladder dyskinesia which cannot be performed      until Tuesday as it is the weekend.  He does have an appointment at      11:45 a.m. on Tuesday.  2.  Mildly elevated liver function tests with an AST of 39, normal range      being 37, and ALT 142, with normal upper end range of 4, mild elevation      in total bilirubin with total bilirubin being 1.6.   DISCHARGE MEDICATIONS:  The patient is to continue his medications at home  as outlined in the medical reconciliation form.  Diovan and Xalatan drops,  in addition to Percocet 5/325 mg three tablets daily as needed, dispense  #20.   DISPOSITION:  The patient, as noted above, is being discharged in a  medically stable condition to have outpatient evaluation of gallbladder  dysfunction.   FOLLOWUP:  He will follow up with Dr. Renaye Rakers subsequently.  He is  instructed to schedule an appointment this week with Dr. Renaye Rakers.   HISTORY OF PRESENT ILLNESS:  For full details, please refer to H&P as  dictated by Dr. Pollyann Kennedy,  briefly [name] is a 60 year old African-  American male who experienced progressive diffuse abdominal pain for the  previous two months.  He described this as achy in nature, radiating down to  the lower part of his abdomen, worse after eating greasy foods.  It has been  persistent.  He has undergone evaluation by Dr. Parke Simmers several months ago and  was treated for H. pylori infection.  He has had colonoscopy.  Otherwise, no  emesis or constipation.  He had some mainly postprandial discomfort in his  abdomen.   HOSPITAL COURSE:  The patient, as noted above, underwent GI evaluation by  Dr. Charna Elizabeth without significant findings.  Please see review of the  incidental systems of kidney.  His LFT's were minimally elevated at this  time.  CT scan was not suggestive of obstruction.  Therefore, at this  time,  he is being scheduled for outpatient further evaluation via HIDA scan,  perhaps gallbladder dysfunction or atypical presentation of cholecystitis.      Hettie Holstein, D.O.  Electronically Signed     ESS/MEDQ  D:  05/31/2005  T:  05/31/2005  Job:  [id number]   cc:   Renaye Rakers, M.D.  Fax: [phone]

## 2011-01-17 NOTE — Discharge Summary (Signed)
NAME:  [name], [name]                        ACCOUNT NO.:  [id number]   MEDICAL RECORD NO.:  [id number]                   PATIENT TYPE:  INP   LOCATION:  0460                                 [facility]ACILITY:  [facility]   PHYSICIAN:  Renaye Rakers, M.D.                   DATE OF BIRTH:  1950-10-20   DATE OF ADMISSION:  01/27/2003  DATE OF DISCHARGE:  02/04/2003                                 DISCHARGE SUMMARY   The patient is a 60 year old male admitted to the hospital after presenting  to the office after being treated on the outpatient basis for pneumonia  which was noted to be right upper lobe.  He complained and was not doing  well and was having some sweats.  He was placed in the hospital.  Blood  cultures were done, I.V. antibiotics were switched, and he was started on  nebulizer treatments also.  On his initial admission to the hospital, his  white count was down to 8.1 with a hemoglobin of 10.9, hematocrit of 32.2.  He does have a history of chronic anemia in the past.  Sodium 138.  Potassium 33.7.  Chloride 105.  CO2 24.  Pulse 101.  BUN 14.  Creatinine  0.8.  Calcium 9.6.  Total protein 7.6.  Albumin 3.6.  Glucose elevated at  127.  Hemoglobin A1C of 5.3.  SGOT of 25.  SGPT of 33.  Alkaline phosphatase  88.  Total bilirubin 0.7.  He had blood cultures done also which so far have  been negative.  Note, that he was placed in the hospital and the patient  continues to have temperatures.  It is noted that he spiked temperatures up  to 101 to 102 during the first couple of days here in the hospital.  They  stayed up progressively throughout the next two to three days, and the  patient still stating he was feeling poorly, we did go ahead and get an  infectious disease consultation.  Infectious Disease noted that the patient  was getting better and there was less of a temperature curve being present  as he continued on the I.V. antibiotics, but felt that we should continue  him on the I.V.  antibiotics and then to follow him on the outpatient basis  with additional antibiotics for a total of ten days which he was totally  concurred with.  I guess the real part reveals that why is this patient  taking so long to recover from this pneumonia.  He does have some COPD,  emphysema, changes of his lungs which could be causing the problem.  At this  point, also we had placed a PPD on the patient to make sure that we are not  dealing with a tuberculosis type of situation and also check on the  patient's immunity status.  That will not be ready until tomorrow.  At this  present time, it  looks like it is going to be probably negative and this  discharge summary is being dictated the day before the discharge as I will  not be around at that time.  He is thus discharged from the hospital in an  improved state with a diagnosis of:   DISCHARGE DIAGNOSES:  1. Pneumonia of long duration.  2. Emphysema.  3. Diabetes mellitus.  4. Hypertension.  5. Anemia, normocytic, of chronic duration.  6. Presumably negative immunological status.   PLAN:  Our plan at the present time is to discharge this patient home, to  place him on antibiotics for an additional week of treatment, and to see him  back in the office in an one week period of time.  We will continue him on  his Toprol XL 50 mg, Benecar HCTZ 20/12.5 mg a day, and he will be seen in  the office in an one week period of time.                                               Renaye Rakers, M.D.    VB/MEDQ  D:  02/03/2003  T:  02/04/2003  Job:  [id number]

## 2011-01-17 NOTE — Op Note (Signed)
NAMEDARLY, FAILS              ACCOUNT NO.:  [id number]   MEDICAL RECORD NO.:  [id number]          PATIENT TYPE:  INP   LOCATION:  5714                         FACILITY:  [facility]   PHYSICIAN:  Anselmo Rod, M.D.  DATE OF BIRTH:  1950-10-19   DATE OF PROCEDURE:  05/30/2005  DATE OF DISCHARGE:                                 OPERATIVE REPORT   PROCEDURE PERFORMED:  Esophagogastroduodenoscopy.   ENDOSCOPIST:  Anselmo Rod, M.D.   INSTRUMENT USED:  Olympus video panendoscope.   INDICATIONS FOR PROCEDURE:  A 60 year old, African-American male with a  history of severe abdominal pain and a normal CT done yesterday, undergoing  an EGD to rule out peptic ulcer disease, esophagitis, gastritis, etc.   PRE-PROCEDURE PREPARATION:  Informed consent was procured from the patient.  The patient was fasted for eight hours prior to the procedure.   PRE-PROCEDURE PHYSICAL:  VITAL SIGNS:  The patient had stable vital signs.  NECK:  Supple.  CHEST:  Clear to auscultation.  CARDIOVASCULAR:  S1, S2 regular.  ABDOMEN:  Soft, with normal bowel sounds.   DESCRIPTION OF THE PROCEDURE:  The patient was placed in the left lateral  decubitus position, sedated with 50 mg of Demerol and 6 mg of Versed in slow  incremental doses.  Once the patient was adequately sedated and maintained  on low-flow oxygen and continuous cardiac monitoring, the Olympus video  panendoscope was advanced through the mouth, placed over the tongue, into  the esophagus under direct vision.  The entire esophagus appeared normal,  with no evidence of ring, stricture, masses, esophagitis, or Barrett's  mucosa.  The scope was then advanced to the stomach.  Entire gastric mucosa  and the proximal small bowel appeared normal.   IMPRESSION:  Normal EGD.   RECOMMENDATIONS:  1.  Continue PPIs.  2.  Avoid nonsteroidals.  3.  Outpatient followup in the next four weeks, earlier if needed.      Anselmo Rod, M.D.  Electronically Signed     JNM/MEDQ  D:  05/30/2005  T:  05/30/2005  Job:  [id number]   cc:   Renaye Rakers, M.D.  Fax: [phone]
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
## 2011-01-17 NOTE — Op Note (Signed)
[name], [name]              ACCOUNT NO.:  [id number]   MEDICAL RECORD NO.:  [id number]          PATIENT TYPE:  INP   LOCATION:  2894                         FACILITY:  [facility]   PHYSICIAN:  Stefani Dama, M.D.  DATE OF BIRTH:  [birth year]   DATE OF PROCEDURE:  12/23/2004  DATE OF DISCHARGE:                                 OPERATIVE REPORT   PREOPERATIVE DIAGNOSIS:  Cervical spondylosis with cervical radiculopathy C3-  4 and C4-5.   POSTOPERATIVE DIAGNOSIS:  Cervical spondylosis with cervical radiculopathy  C3-4 and C4-5.   PROCEDURE:  Anterior cervical decompression C3-4, C4-5.  Arthrodesis with  structural allograft, Alphatek plate fixation.   SURGEON:  Stefani Dama, M.D.   FIRST ASSISTANT:  Coletta Memos, M.D.   ANESTHESIA:  General endotracheal.   INDICATIONS FOR PROCEDURE:  [name] is a 60 year old individual who has  had significant bilateral neck and shoulder pains, seemingly worse on his  right side than it had been. We had been suffering with this since November  and despite efforts at conservative management, has been continually getting  worse.  An MRI performed more recently demonstrates that he has multilevel  cervical spondylitic disease starting at C3-4 with right-sided spondylitic  overgrowth and foraminal stenosis, C4-5 with bi-foraminal stenosis, and C5-6  and C6-7 where he has modest disk degeneration with evidence of significant  spondylitic overgrowth posteriorly.   At no level is there any evidence of spinal cord compression, however, in  regards to symptoms, it seems that they are most referable to the C3-4 and  C4-5 level with the distribution of pain in the neck and shoulders out in a  cape-like distribution, no extension of symptoms down into his arms or  hands.  Having failed efforts at conservative management, he was advised  regarding surgical decompression of the C3-4 and 4-5 levels.  He is now  admitted for this procedure.   DESCRIPTION OF PROCEDURE:  The patient was brought to the operating room,  supine on the stretcher. After the smooth induction of general endotracheal  anesthesia, he was placed in 5 pounds of Halter traction, neck was prepped  with DuraPrep and draped in a sterile fashion.  A transverse incision was  created in the left side of the neck and carried down through the platysma.  The plane between the sternocleidomastoid and the strap muscles was  dissected bluntly until the prevertebral space was reached.   The first identifiable disk space was noted to be C4-C5 on radiograph.  Dissection was then carried cephalad in the same plane until C3-C4 was  identified and isolated. The longus colli muscle was stripped off of either  side in the midline. Large ventral, osteophytic overgrowths were noted to  encapsule and distort the muscle bed and the muscle bellies. There was a  large, ventral osteophyte also noted at C5-C6 level which was easily  palpable.   Once the soft tissues were dissected, Caspar retractors were placed under  longus colli muscles on either side to maintain the exposure. The diskectomy  was then performed at C4-C5, first by removing ventral osteophytes using  a  combination of Optician, dispensing. The disk space was  entered and a significant quantity of severely degenerated disk material  removed from within it.  As the region of the posterior longitudinal  ligament was reached and identified, a significant osteophytic overgrowth  was noted to extend posteriorly from the inferior margin of the body of C4.  This was drilled down with the high speed bur and a 2.3 mm dissecting tool.  Dissection was also carried out in the lateral recesses, where substantial  uncinate process hypertrophy was noted to be present.  Undercutting this  then allowed placement of a 1 and 2 mm Kerrison punch and removal of the  posterior longitudinal ligament.  This allowed for  identification of the  common dural tube and the region of the take-off of the C5 nerve root on the  right side.   Dissection was then carried across the left side, and the C5 nerve root was  similarly decompressed on the left side.  Hemostasis from bleeding, epidural  veins was obtained with bipolar cautery, and some small pledgets of Gelfoam  soaked in thrombin which were later irrigated away.  Once an adequate  compression was completed, attention was turned to C3-C4 where similar  ventral osteophytosis had occurred and this was taken down with the same  rongeurs.  Diskectomy was completed here. The disk space, again, was noted  to be significantly degenerated and osteophytic overgrowth was noted to have  occurred from the inferior margin of the body of C3.   This was drilled down in a similar fashion and dissection was again carried  out to both lateral recesses, until ultimately, the common dural tube and C5  nerve root superiorly could be identified. Hemostasis was again achieved in  a similar fashion.   Then 2 trans-grafts measuring 7 mm in height were ground to their  appropriate size and dimension, filled with demineralized bone matrix and  then placed into the interspace at C3-4 and again at C4-5. These were  counter-sunk just slightly less than 1 mm. A 34 mm standard size Alphatek  plate was then fitted to the ventral aspects of the vertebral bodies and  secured with locking 4 x 14 mm screws in C5 and C4, variable angle screws in  C3. Final localizing radiograph identified good position of the construct,  good placement of the bone grafts.  Hemostasis was then obtained  meticulously in the soft tissues and the longus colli muscle after removal  of the retractor blades.  Care was taken to make sure that hemostasis was  obtained meticulously, all the way up to the subcuticular layer.  The platysma was then closed with 3-0 Vicryl in interrupted fashion, 3-0  Vicryl was used in  the subcuticular skin to close it. Dermabond was placed  on the skin. The patient tolerated the procedure well. Blood loss was  estimated at about 100 mL.  He was returned to the recovery room in stable  condition.      HJE/MEDQ  D:  12/23/2004  T:  12/23/2004  Job:  [id number]

## 2011-01-17 NOTE — H&P (Signed)
NAMENIKLAS, CHRETIEN              ACCOUNT NO.:  [id number]   MEDICAL RECORD NO.:  [id number]          PATIENT TYPE:  INP   LOCATION:  5714                         FACILITY:  [facility]   PHYSICIAN:  Renato Battles, M.D.     DATE OF BIRTH:  April 10, 1951   DATE OF ADMISSION:  05/29/2005  DATE OF DISCHARGE:                                HISTORY & PHYSICAL   REASON FOR ADMISSION:  Abdominal pain.   PRIMARY CARE PHYSICIAN:  Renaye Rakers, M.D.   GASTROENTEROLOGIST:  Anselmo Rod, M.D.   HISTORY OF PRESENT ILLNESS:  The patient is a pleasant 60 year old African-  American male who had experienced progressive, diffuse abdominal pain for  the last two months. The pain is aching in nature, radiates down to the  lower part of his abdomen, worse after eating any oil intake including  water. It is persistent with no waxing or waning, interferes with the  patient's sleep. No nausea or vomiting associated with it. No diarrhea or  constipation, although he reports that he has to use the bathroom right  after he eats. The pain did not respond to Prevacid. The patient denies any  fevers. Reports some weight fluctuation up and down with no certain pattern.   REVIEW OF SYSTEMS:  CONSTITUTIONAL:  No fevers, chills, or night sweats.  Positive for weight fluctuation and decreased appetite. No significant  weight loss. GI:  Positive for abdominal pain but no nausea or vomiting. No  diarrhea or constipation. GU:  No dysuria, hematuria, or retention.  CARDIOPULMONARY:  No chest pain, shortness of breath, no orthopnea or PND.   PAST MEDICAL HISTORY:  1.  Diet controlled diabetes.  2.  Hypertension.  3.  History of alcoholism.  4.  Glaucoma.   PAST SURGICAL HISTORY:  1.  Back surgery x5.  2.  Cervical decompression.  3.  Laser eye surgery for glaucoma.   SOCIAL HISTORY:  Unemployed. Smokes one pack per day for the last 30 years.  Denies alcohol intake at this time. Admits to using marijuana.   FAMILY  HISTORY:  Positive for diabetes.   ALLERGIES:  No known drug allergies.   MEDICATIONS:  1.  Xalatan eye drops.  2.  Diovan HCT.   PHYSICAL EXAMINATION:  GENERAL:  Alert and oriented x3. No acute distress.  VITAL SIGNS:  I do not have any vital signs at this point.  HEENT:  Head is normocephalic and atraumatic. Pupils are equal, round, and  reactive to light and accommodation.  NECK:  No lymphadenopathy, no thyromegaly, no JVD.  CHEST:  Clear to auscultation bilaterally. No wheezing, rales, rhonchi.  HEART:  Regular rate and rhythm. No murmurs.  ABDOMEN:  Soft. Left upper quadrant and left lower quadrant tenderness on  deep palpation. No rebound tenderness. Positive bowel sounds.  EXTREMITIES:  No clubbing, cyanosis, or edema.   LABORATORY DATA:  I have CBC, CMP, thyroid studies, cholesterol all obtained  August 2006 all within normal limits. Helicobacter antibodies were positive.   ASSESSMENT/PLAN:  1.  Abdominal pain:  Still unclear etiology of nature. Abdominal CT done by  Dr. Loreta Ave as outpatient was read as negative. To my eyes, it was notable      that the patient has a lot of calcification in his abdominal vessels      which support the theory of mesenteric ischemia; however, pancreatitis      and peptic ulcer disease have to be ruled out. The patient will be on      clear liquids. We are going to check lipase and amylase as well as the      new set of electrolytes. Dr. Loreta Ave is planning esophagogastroduodenoscopy      in the morning. So, we will make him n.p.o. after midnight. He will be      on high dose protonix.  2.  Hypertension:  Continue with Diovan/hydrochlorothiazide.  3.  Diabetes:  Check hemoglobin A1c. Currently, he is diet controlled.  4.  Glaucoma:  Continue with the Xalatan.      Renato Battles, M.D.  Electronically Signed     SA/MEDQ  D:  05/29/2005  T:  05/30/2005  Job:  [id number]   cc:   Renaye Rakers, M.D.  Fax: [phone]   NWGNFA OZH YQMV, M.D.   Fax: [phone]

## 2011-01-17 NOTE — Op Note (Signed)
[name], [name]              ACCOUNT NO.:  [id number]   MEDICAL RECORD NO.:  [id number]          PATIENT TYPE:  AMB   LOCATION:  ENDO                         FACILITY:  [facility]   PHYSICIAN:  Anselmo Rod, M.D.  DATE OF BIRTH:  05-Jul-1951   DATE OF PROCEDURE:  01/08/2005  DATE OF DISCHARGE:                                 OPERATIVE REPORT   PROCEDURE PERFORMED:  Colonoscopy with multiple cold biopsies.   ENDOSCOPIST:  Charna Elizabeth, M.D.   INSTRUMENT USED:  Olympus video colonoscope.   INDICATIONS FOR PROCEDURE:  A 60 year old African-American male with a  history of diabetes undergoing screening colonoscopy to rule out colonic  polyps, masses, etc.   PREPROCEDURE PREPARATION:  Informed consent was procured from the patient.  The patient was fasted for eight hours prior to the procedure and prepped  with a bottle of magnesium citrate and a gallon of GoLYTELY the night prior  to the procedure.   PREPROCEDURE PHYSICAL:  The patient had stable vital signs.  Neck supple.  Chest clear to auscultation.  S1 and S2 regular.  Abdomen soft with normal  bowel sounds.  The patient's neck was in a collar from a recent surgery.   DESCRIPTION OF PROCEDURE:  The patient was placed in left lateral decubitus  position and sedated with 90 mcg of fentanyl and 9 mg of Versed in slow  incremental doses.  Once the patient was adequately sedated and maintained  on low flow oxygen and continuous cardiac monitoring, the Olympus video  colonoscope was advanced from the rectum to the cecum.  The appendicular  orifice and ileocecal valve were clearly visualized and photographed.  Small  internal hemorrhoids were seen on retroflexion.  Multiple rectosigmoid  polyps were biopsied for pathology (cold biopsies times eight).  The rest of  the exam was unremarkable.  There was some residual stool in the colon.  Multiple washes were done.   IMPRESSION:  1.  Small nonbleeding internal hemorrhoids.  2.   Multiple rectosigmoid polyps biopsied for pathology (cold biopsies).  3.  Otherwise normal-appearing sigmoid colon, transverse colon, rectum and      cecum.   RECOMMENDATIONS:  1.  Await pathology results.  2.  Continue high fiber diet with liberal fluid intake.  3.  Repeat colorectal cancer screening depending on pathology results.  4.  Outpatient followup in the next two weeks or earlier if need be.      JNM/MEDQ  D:  01/08/2005  T:  01/09/2005  Job:  [id number]   cc:   Renaye Rakers, M.D.  [phone] [address]  [place]  [place] [address]  Fax: [phone]

## 2011-05-29 LAB — POCT I-STAT, CHEM 8
Glucose, Bld: 104 — ABNORMAL HIGH
HCT: 37 — ABNORMAL LOW
Hemoglobin: 12.6 — ABNORMAL LOW
Sodium: 139
TCO2: 23

## 2011-05-30 LAB — CBC
HCT: 27.6 — ABNORMAL LOW
HCT: 33 — ABNORMAL LOW
Hemoglobin: 12.3 — ABNORMAL LOW
Hemoglobin: 9.9 — ABNORMAL LOW
MCHC: 33.2
MCHC: 33.8
MCV: 96.1
MCV: 96.7
Platelets: 153
Platelets: 175
Platelets: 202
RBC: 3.04 — ABNORMAL LOW
RBC: 3.85 — ABNORMAL LOW
RDW: 14.8
RDW: 14.9
RDW: 15.2
WBC: 6.1
WBC: 6.4
WBC: 7.1
WBC: 8.2

## 2011-05-30 LAB — BASIC METABOLIC PANEL
BUN: 15
CO2: 25
Calcium: 9.8
Chloride: 104
Creatinine, Ser: 0.95
Creatinine, Ser: 1.16
GFR calc non Af Amer: >60
Glucose, Bld: 107 — ABNORMAL HIGH
Glucose, Bld: 112 — ABNORMAL HIGH
Potassium: 4
Potassium: 4.1
Sodium: 135

## 2011-05-30 LAB — URINALYSIS, ROUTINE W REFLEX MICROSCOPIC
Hgb urine dipstick: NEGATIVE
Hgb urine dipstick: NEGATIVE
Nitrite: NEGATIVE
Nitrite: NEGATIVE
Protein, ur: NEGATIVE
Specific Gravity, Urine: 1.013
Urobilinogen, UA: 0.2
pH: 5.5

## 2011-05-30 LAB — GLUCOSE, CAPILLARY
Glucose-Capillary: 103 — ABNORMAL HIGH
Glucose-Capillary: 108 — ABNORMAL HIGH
Glucose-Capillary: 108 — ABNORMAL HIGH
Glucose-Capillary: 121 — ABNORMAL HIGH
Glucose-Capillary: 123 — ABNORMAL HIGH
Glucose-Capillary: 126 — ABNORMAL HIGH
Glucose-Capillary: 97
Glucose-Capillary: 99

## 2011-05-30 LAB — COMPREHENSIVE METABOLIC PANEL
Albumin: 4.4
BUN: 12
Calcium: 9.9
Chloride: 105
Creatinine, Ser: 1
Glucose, Bld: 124 — ABNORMAL HIGH
Potassium: 4.2
Sodium: 137

## 2011-05-30 LAB — PROTIME-INR: Prothrombin Time: 12.8

## 2011-05-30 LAB — HEMOGLOBIN A1C
Hgb A1c MFr Bld: 5.6
Mean Plasma Glucose: 122

## 2012-07-06 ENCOUNTER — Encounter: Payer: Self-pay | Admitting: Vascular Surgery

## 2012-09-01 DIAGNOSIS — C801 Malignant (primary) neoplasm, unspecified: Secondary | ICD-10-CM

## 2012-09-01 HISTORY — DX: Malignant (primary) neoplasm, unspecified: C80.1

## 2012-09-21 ENCOUNTER — Other Ambulatory Visit: Payer: Self-pay | Admitting: *Deleted

## 2012-09-21 DIAGNOSIS — I739 Peripheral vascular disease, unspecified: Secondary | ICD-10-CM

## 2012-09-21 DIAGNOSIS — Z48812 Encounter for surgical aftercare following surgery on the circulatory system: Secondary | ICD-10-CM

## 2012-09-28 ENCOUNTER — Encounter: Payer: Self-pay | Admitting: Neurosurgery

## 2012-10-01 ENCOUNTER — Encounter: Payer: Self-pay | Admitting: Neurosurgery

## 2012-10-04 ENCOUNTER — Encounter (INDEPENDENT_AMBULATORY_CARE_PROVIDER_SITE_OTHER): Payer: Medicare Other | Admitting: *Deleted

## 2012-10-04 ENCOUNTER — Encounter: Payer: Self-pay | Admitting: Neurosurgery

## 2012-10-04 ENCOUNTER — Ambulatory Visit (INDEPENDENT_AMBULATORY_CARE_PROVIDER_SITE_OTHER): Payer: Medicare Other | Admitting: Neurosurgery

## 2012-10-04 VITALS — BP 152/101 | HR 106 | Resp 18 | Ht 74.0 in | Wt 193.0 lb

## 2012-10-04 DIAGNOSIS — R209 Unspecified disturbances of skin sensation: Secondary | ICD-10-CM

## 2012-10-04 DIAGNOSIS — I739 Peripheral vascular disease, unspecified: Secondary | ICD-10-CM

## 2012-10-04 DIAGNOSIS — Z48812 Encounter for surgical aftercare following surgery on the circulatory system: Secondary | ICD-10-CM

## 2012-10-04 DIAGNOSIS — R252 Cramp and spasm: Secondary | ICD-10-CM

## 2012-10-04 DIAGNOSIS — R200 Anesthesia of skin: Secondary | ICD-10-CM

## 2012-10-04 DIAGNOSIS — R202 Paresthesia of skin: Secondary | ICD-10-CM | POA: Insufficient documentation

## 2012-10-04 DIAGNOSIS — M79605 Pain in left leg: Secondary | ICD-10-CM | POA: Insufficient documentation

## 2012-10-04 DIAGNOSIS — R238 Other skin changes: Secondary | ICD-10-CM

## 2012-10-04 DIAGNOSIS — M79609 Pain in unspecified limb: Secondary | ICD-10-CM

## 2012-10-04 NOTE — Progress Notes (Signed)
[facility] PAD/PVD Office Note  CC: PAD surveillance Referring Physician: Edilia Bo  History of Present Illness: 62 year old male patient of Dr. Edilia Bo is status post a left superficial femoral artery to anterior tibial artery bypass with Dr. Madilyn Fireman in 2009. The patient denies true claudication or rest pain but states he does have "cramps" in his left thigh from time to time and always at rest. The patient does note some coolness in his left foot and has a history of 5 lumbar surgeries.  Past Medical History  Diagnosis Date  . Diabetes mellitus without complication   . Hypertension   . Arthritis 04/07/08    Degenerative Disc Disease  . Irritable bowel syndrome 04/07/08  . Glaucoma   . Peripheral vascular disease     ROS: [x]  Positive   [ ]  Denies    General: [ ]  Weight loss, [ ]  Fever, [ ]  chills Neurologic: [ ]  Dizziness, [ ]  Blackouts, [ ]  Seizure [ ]  Stroke, [ ]  "Mini stroke", [ ]  Slurred speech, [ ]  Temporary blindness; [ ]  weakness in arms or legs, [ ]  Hoarseness Cardiac: [ ]  Chest pain/pressure, [ ]  Shortness of breath at rest [ ]  Shortness of breath with exertion, [ ]  Atrial fibrillation or irregular heartbeat Vascular: [ ]  Pain in legs with walking, [ ]  Pain in legs at rest, [ ]  Pain in legs at night,  [ ]  Non-healing ulcer, [ ]  Blood clot in vein/DVT,   Pulmonary: [ ]  Home oxygen, [ ]  Productive cough, [ ]  Coughing up blood, [ ]  Asthma,  [ ]  Wheezing Musculoskeletal:  [ ]  Arthritis, [ ]  Low back pain, [ ]  Joint pain Hematologic: [ ]  Easy Bruising, [ ]  Anemia; [ ]  Hepatitis Gastrointestinal: [ ]  Blood in stool, [ ]  Gastroesophageal Reflux/heartburn, [ ]  Trouble swallowing Urinary: [ ]  chronic Kidney disease, [ ]  on HD - [ ]  MWF or [ ]  TTHS, [ ]  Burning with urination, [ ]  Difficulty urinating Skin: [ ]  Rashes, [ ]  Wounds Psychological: [ ]  Anxiety, [ ]  Depression   Social History History  Substance Use Topics  . Smoking status: Current Every  Day Smoker -- 0.5 packs/day    Types: Cigarettes  . Smokeless tobacco: Never Used  . Alcohol Use: No    Family History Family History  Problem Relation Age of Onset  . Congestive Heart Failure Mother   . Diabetes Mother   . Heart disease Mother     Coronary Disease  . Heart disease Father     Coronary Disease with Bypass Surgery  . Diabetes Paternal Aunt   . Stroke Paternal Aunt   . Diabetes Paternal Uncle   . Stroke Paternal Uncle     Not on File  Current Outpatient Prescriptions  Medication Sig Dispense Refill  . Aliskiren-Hydrochlorothiazide (TEKTURNA HCT PO) Take 300 mg by mouth daily.      Marland Kitchen amLODipine-olmesartan (AZOR) 10-40 MG per tablet Take 1 tablet by mouth daily.      . brimonidine (ALPHAGAN) 0.2 % ophthalmic solution Place 1 drop into both eyes 3 (three) times daily.      Marland Kitchen buPROPion (WELLBUTRIN SR) 150 MG 12 hr tablet Take 150 mg by mouth 2 (two) times daily.      . clopidogrel (PLAVIX) 75 MG tablet Take 75 mg by mouth daily.      . nebivolol (BYSTOLIC) 10 MG tablet Take 10 mg by mouth daily.      . sildenafil (VIAGRA) 25 MG  tablet Take 25 mg by mouth daily as needed.        Physical Examination  Filed Vitals:   10/04/12 1442  BP: 152/101  Pulse: 106  Resp: 18    Body mass index is 24.78 kg/(m^2).  General:  WDWN in NAD Gait: Normal HEENT: WNL Eyes: Pupils equal Pulmonary: normal non-labored breathing , without Rales, rhonchi,  wheezing Cardiac: RRR, without  Murmurs, rubs or gallops; No carotid bruits Abdomen: soft, NT, no masses Skin: no rashes, ulcers noted Vascular Exam/Pulses: Palpable lower extremity pulses bilaterally  Extremities without ischemic changes, no Gangrene , no cellulitis; no open wounds;  Musculoskeletal: no muscle wasting or atrophy  Neurologic: A&O X 3; Appropriate Affect ; SENSATION: normal; MOTOR FUNCTION:  moving all extremities equally. Speech is fluent/normal  Non-Invasive Vascular Imaging: ABIs today are 0.96 and  biphasic on the right, 0.87 and biphasic on the left with a patent bypass graft. This is consistent if not slightly improved from previous exam one year ago  ASSESSMENT/PLAN: This is a patient with improved claudication with occasional cramp in his left thigh, the patient declines any further diagnostics or intervention. The patient will followup in one year with repeat ABIs and bypass graft scan, the patient's questions were encouraged and answered, he is in agreement with this plan.  Lauree Chandler ANP  Clinic M.D.: Myra Gianotti

## 2012-10-05 NOTE — Addendum Note (Signed)
Addended by: Sharee Pimple on: 10/05/2012 09:03 AM   Modules accepted: Orders

## 2013-04-03 ENCOUNTER — Emergency Department (HOSPITAL_COMMUNITY): Payer: Medicare Other

## 2013-04-03 ENCOUNTER — Encounter (HOSPITAL_COMMUNITY): Payer: Self-pay | Admitting: Emergency Medicine

## 2013-04-03 ENCOUNTER — Inpatient Hospital Stay (HOSPITAL_COMMUNITY)
Admission: EM | Admit: 2013-04-03 | Discharge: 2013-04-11 | DRG: 294 | Disposition: A | Discharge status: Home / Self Care | Payer: Medicare Other | Attending: Internal Medicine | Admitting: Internal Medicine

## 2013-04-03 ENCOUNTER — Inpatient Hospital Stay (HOSPITAL_COMMUNITY): Payer: Medicare Other

## 2013-04-03 DIAGNOSIS — N179 Acute kidney failure, unspecified: Secondary | ICD-10-CM | POA: Diagnosis present

## 2013-04-03 DIAGNOSIS — R209 Unspecified disturbances of skin sensation: Secondary | ICD-10-CM

## 2013-04-03 DIAGNOSIS — R109 Unspecified abdominal pain: Secondary | ICD-10-CM

## 2013-04-03 DIAGNOSIS — F17200 Nicotine dependence, unspecified, uncomplicated: Secondary | ICD-10-CM

## 2013-04-03 DIAGNOSIS — D649 Anemia, unspecified: Secondary | ICD-10-CM | POA: Diagnosis present

## 2013-04-03 DIAGNOSIS — Z720 Tobacco use: Secondary | ICD-10-CM | POA: Diagnosis present

## 2013-04-03 DIAGNOSIS — M79605 Pain in left leg: Secondary | ICD-10-CM

## 2013-04-03 DIAGNOSIS — E278 Other specified disorders of adrenal gland: Secondary | ICD-10-CM

## 2013-04-03 DIAGNOSIS — I823 Embolism and thrombosis of renal vein: Secondary | ICD-10-CM | POA: Diagnosis present

## 2013-04-03 DIAGNOSIS — I82220 Acute embolism and thrombosis of inferior vena cava: Principal | ICD-10-CM | POA: Diagnosis present

## 2013-04-03 DIAGNOSIS — E119 Type 2 diabetes mellitus without complications: Secondary | ICD-10-CM | POA: Diagnosis present

## 2013-04-03 DIAGNOSIS — I739 Peripheral vascular disease, unspecified: Secondary | ICD-10-CM | POA: Diagnosis present

## 2013-04-03 DIAGNOSIS — E871 Hypo-osmolality and hyponatremia: Secondary | ICD-10-CM

## 2013-04-03 DIAGNOSIS — I10 Essential (primary) hypertension: Secondary | ICD-10-CM | POA: Diagnosis present

## 2013-04-03 DIAGNOSIS — D3500 Benign neoplasm of unspecified adrenal gland: Secondary | ICD-10-CM | POA: Diagnosis present

## 2013-04-03 DIAGNOSIS — R252 Cramp and spasm: Secondary | ICD-10-CM

## 2013-04-03 DIAGNOSIS — N289 Disorder of kidney and ureter, unspecified: Secondary | ICD-10-CM

## 2013-04-03 DIAGNOSIS — H409 Unspecified glaucoma: Secondary | ICD-10-CM | POA: Diagnosis present

## 2013-04-03 DIAGNOSIS — R200 Anesthesia of skin: Secondary | ICD-10-CM

## 2013-04-03 DIAGNOSIS — E279 Disorder of adrenal gland, unspecified: Secondary | ICD-10-CM

## 2013-04-03 LAB — RETICULOCYTES
RBC.: 2.75 MIL/uL — ABNORMAL LOW (ref 4.22–5.81)
Retic Ct Pct: 2.2 % (ref 0.4–3.1)

## 2013-04-03 LAB — COMPREHENSIVE METABOLIC PANEL
ALT: 16 U/L (ref 0–53)
Alkaline Phosphatase: 118 U/L — ABNORMAL HIGH (ref 39–117)
BUN: 23 mg/dL (ref 6–23)
CO2: 27 mEq/L (ref 19–32)
Calcium: 10.3 mg/dL (ref 8.4–10.5)
GFR calc Af Amer: 53 mL/min — ABNORMAL LOW (ref >90)
GFR calc non Af Amer: 46 mL/min — ABNORMAL LOW (ref >90)
Glucose, Bld: 121 mg/dL — ABNORMAL HIGH (ref 70–99)
Sodium: 132 mEq/L — ABNORMAL LOW (ref 135–145)

## 2013-04-03 LAB — PROTIME-INR
INR: 1.12 (ref 0.00–1.49)
Prothrombin Time: 14.2 seconds (ref 11.6–15.2)

## 2013-04-03 LAB — CBC WITH DIFFERENTIAL/PLATELET
Eosinophils Relative: 6 % — ABNORMAL HIGH (ref 0–5)
HCT: 25.4 % — ABNORMAL LOW (ref 39.0–52.0)
Hemoglobin: 8.5 g/dL — ABNORMAL LOW (ref 13.0–17.0)
Lymphocytes Relative: 21 % (ref 12–46)
Lymphs Abs: 1.5 10*3/uL (ref 0.7–4.0)
MCV: 93.4 fL (ref 78.0–100.0)
Monocytes Absolute: 0.7 10*3/uL (ref 0.1–1.0)
Monocytes Relative: 10 % (ref 3–12)
Platelets: 187 10*3/uL (ref 150–400)
RBC: 2.72 MIL/uL — ABNORMAL LOW (ref 4.22–5.81)
WBC: 7.1 10*3/uL (ref 4.0–10.5)

## 2013-04-03 LAB — URINALYSIS, ROUTINE W REFLEX MICROSCOPIC
Bilirubin Urine: NEGATIVE
Protein, ur: NEGATIVE mg/dL
Urobilinogen, UA: 0.2 mg/dL (ref 0.0–1.0)

## 2013-04-03 LAB — IRON AND TIBC: TIBC: 337 ug/dL (ref 215–435)

## 2013-04-03 LAB — URINE MICROSCOPIC-ADD ON

## 2013-04-03 MED ORDER — HYDROMORPHONE HCL PF 1 MG/ML IJ SOLN: 1.0000 mg | INTRAMUSCULAR | Status: DC | PRN

## 2013-04-03 MED ORDER — SODIUM CHLORIDE 0.9 % IV SOLN
INTRAVENOUS | Status: DC
  Administered 2013-04-03 – 2013-04-04 (×2): via INTRAVENOUS

## 2013-04-03 MED ORDER — ENOXAPARIN SODIUM 100 MG/ML ~~LOC~~ SOLN
1.0000 mg/kg | Freq: Once | SUBCUTANEOUS | Status: AC
  Administered 2013-04-03: 85 mg via SUBCUTANEOUS
  Filled 2013-04-03: qty 1

## 2013-04-03 MED ORDER — BUPROPION HCL ER (SR) 150 MG PO TB12: 150.0000 mg | ORAL_TABLET | Freq: Two times a day (BID) | ORAL | Status: DC

## 2013-04-03 MED ORDER — HYDROMORPHONE HCL PF 1 MG/ML IJ SOLN
0.5000 mg | INTRAMUSCULAR | Status: DC | PRN
  Administered 2013-04-03 (×4): 0.5 mg via INTRAVENOUS
  Filled 2013-04-03 (×4): qty 1

## 2013-04-03 MED ORDER — POLYETHYLENE GLYCOL 3350 17 G PO PACK
17.0000 g | PACK | Freq: Every day | ORAL | Status: DC | PRN
  Filled 2013-04-03: qty 1

## 2013-04-03 MED ORDER — IOHEXOL 300 MG/ML  SOLN
50.0000 mL | Freq: Once | INTRAMUSCULAR | Status: AC | PRN
  Administered 2013-04-03: 50 mL via INTRAVENOUS

## 2013-04-03 MED ORDER — CLOPIDOGREL BISULFATE 75 MG PO TABS: 75.0000 mg | ORAL_TABLET | Freq: Every day | ORAL | Status: DC

## 2013-04-03 MED ORDER — SODIUM CHLORIDE 0.9 % IJ SOLN
3.0000 mL | Freq: Two times a day (BID) | INTRAMUSCULAR | Status: DC
  Administered 2013-04-03 – 2013-04-11 (×10): 3 mL via INTRAVENOUS

## 2013-04-03 MED ORDER — ENOXAPARIN SODIUM 100 MG/ML ~~LOC~~ SOLN
1.0000 mg/kg | Freq: Two times a day (BID) | SUBCUTANEOUS | Status: DC
  Filled 2013-04-03 (×2): qty 1

## 2013-04-03 MED ORDER — HEPARIN BOLUS VIA INFUSION
4000.0000 [IU] | Freq: Once | INTRAVENOUS | Status: AC
  Administered 2013-04-03: 4000 [IU] via INTRAVENOUS
  Filled 2013-04-03: qty 4000

## 2013-04-03 MED ORDER — SODIUM CHLORIDE 0.9 % IV SOLN
INTRAVENOUS | Status: AC
  Administered 2013-04-03: 100 mL/h via INTRAVENOUS

## 2013-04-03 MED ORDER — HEPARIN (PORCINE) IN NACL 100-0.45 UNIT/ML-% IJ SOLN
1400.0000 [IU]/h | INTRAMUSCULAR | Status: DC
  Administered 2013-04-03: 1200 [IU]/h via INTRAVENOUS
  Administered 2013-04-04 – 2013-04-11 (×9): 1400 [IU]/h via INTRAVENOUS
  Filled 2013-04-03 (×18): qty 250

## 2013-04-03 MED ORDER — ONDANSETRON HCL 4 MG/2ML IJ SOLN: 4.0000 mg | Freq: Three times a day (TID) | INTRAMUSCULAR | Status: DC | PRN

## 2013-04-03 MED ORDER — GADOBENATE DIMEGLUMINE 529 MG/ML IV SOLN
20.0000 mL | Freq: Once | INTRAVENOUS | Status: AC | PRN
  Administered 2013-04-03: 20 mL via INTRAVENOUS

## 2013-04-03 MED ORDER — IOHEXOL 300 MG/ML  SOLN
100.0000 mL | Freq: Once | INTRAMUSCULAR | Status: AC | PRN
  Administered 2013-04-03: 100 mL via INTRAVENOUS

## 2013-04-03 MED ORDER — HYDROMORPHONE HCL PF 1 MG/ML IJ SOLN
1.0000 mg | Freq: Once | INTRAMUSCULAR | Status: AC
  Administered 2013-04-03: 1 mg via INTRAVENOUS
  Filled 2013-04-03: qty 1

## 2013-04-03 MED ORDER — HYDROMORPHONE HCL PF 1 MG/ML IJ SOLN
0.5000 mg | INTRAMUSCULAR | Status: DC | PRN
  Administered 2013-04-04 (×2): 0.5 mg via INTRAVENOUS
  Filled 2013-04-03 (×2): qty 1

## 2013-04-03 MED ORDER — ONDANSETRON 4 MG PO TBDP
4.0000 mg | ORAL_TABLET | Freq: Once | ORAL | Status: AC
  Administered 2013-04-03: 4 mg via ORAL
  Filled 2013-04-03: qty 1

## 2013-04-03 NOTE — ED Notes (Signed)
Pt told [facility] pain was 7. Pt stated pain was 7 prior to dilaudid given. Nad.

## 2013-04-03 NOTE — H&P (Signed)
[facility] History and Physical  [name] WUJ:[id number] DOB: [birth year] DOA: 04/03/2013  Referring physician: Dr. Preston Fleeting PCP: Geraldo Pitter, MD  Specialists: none  Chief Complaint: abdominal pain  HPI: [name] is a 62 y.o. male has a past medical history significant for peripheral vascular disease s/p superficial femoral artery to anterior tibial artery bypass with Dr. Madilyn Fireman in 2009, hypertension, tobacco abuse, presents to the emergency room with a chief complaint of left-sided abdominal pain. He's had some nonspecific abdominal pain on and off for the past couple months, and patient thinks that all of this started when he got a tooth extraction followed by several courses of antibiotics for infection. He feels like his abdominal pain may have gotten a little bit worse in the past couple weeks, however last night he feels like he had a different type of abdominal pain into the left lower quadrant and going into his left flank. It is pleuritic in nature and rates it as severe (9/10). He denies any nausea, vomiting or diarrhea. She also describes on and off right-sided chest pain. He denies any shortness of breath. He denies any lightheadedness or dizziness. In the emergency room, patient underwent a CT of the abdomen and pelvis which showed left renal vein thrombosis.   Review of Systems: As per history of present illness, otherwise negative  Past Medical History  Diagnosis Date  . Diabetes mellitus without complication   . Hypertension   . Arthritis 04/07/08    Degenerative Disc Disease  . Irritable bowel syndrome 04/07/08  . Glaucoma   . Peripheral vascular disease    Past Surgical History  Procedure Laterality Date  . Colonoscopy  01/11/11  . Femoral-tibial bypass graft  Aug. 7, 2009    Left  SFA to AFA   Social History:  reports that he has been smoking Cigarettes.  He has been smoking about 0.50 packs per day. He has never used smokeless tobacco. He reports that he  does not drink alcohol or use illicit drugs.  No Known Allergies  Family History  Problem Relation Age of Onset  . Congestive Heart Failure Mother   . Diabetes Mother   . Heart disease Mother     Coronary Disease  . Heart disease Father     Coronary Disease with Bypass Surgery  . Diabetes Paternal Aunt   . Stroke Paternal Aunt   . Diabetes Paternal Uncle   . Stroke Paternal Uncle     Prior to Admission medications   Medication Sig Start Date End Date Taking? Authorizing Provider  acidophilus (RISAQUAD) CAPS Take 1 capsule by mouth daily.   Yes Historical Provider, MD  amLODipine (NORVASC) 10 MG tablet Take 10 mg by mouth daily.   Yes Historical Provider, MD  brimonidine (ALPHAGAN) 0.2 % ophthalmic solution Place 1 drop into both eyes 3 (three) times daily.   Yes Historical Provider, MD  latanoprost (XALATAN) 0.005 % ophthalmic solution Place into both eyes At bedtime. 08/23/12  Yes Historical Provider, MD  losartan-hydrochlorothiazide (HYZAAR) 100-25 MG per tablet Take 1 tablet by mouth daily.   Yes Historical Provider, MD  metoCLOPramide (REGLAN) 10 MG tablet Take 10 mg by mouth 4 (four) times daily.   Yes Historical Provider, MD  Aliskiren-Hydrochlorothiazide (TEKTURNA HCT PO) Take 300 mg by mouth daily.    Historical Provider, MD  amLODipine-olmesartan (AZOR) 10-40 MG per tablet Take 1 tablet by mouth daily.    Historical Provider, MD  aspirin 81 MG tablet Take 81 mg by  mouth daily.    Historical Provider, MD  buPROPion (WELLBUTRIN SR) 150 MG 12 hr tablet Take 150 mg by mouth 2 (two) times daily.    Historical Provider, MD  clopidogrel (PLAVIX) 75 MG tablet Take 75 mg by mouth daily.    Historical Provider, MD  nebivolol (BYSTOLIC) 10 MG tablet Take 10 mg by mouth daily.    Historical Provider, MD  sildenafil (VIAGRA) 25 MG tablet Take 25 mg by mouth daily as needed.    Historical Provider, MD   Physical Exam: Filed Vitals:   04/03/13 0436  BP: 119/81  Pulse: 97  Temp: 97.9  F (36.6 C)  TempSrc: Oral  Resp: 18  Height: 6\' 2"  (1.88 m)  Weight: 86.183 kg (190 lb)  SpO2: 100%     General:  No apparent distress  Eyes: PERRL, EOMI, no scleral icterus  ENT: moist oropharynx  Neck: supple, no JVD  Cardiovascular: regular rate without MRG; 2+ peripheral pulses  Respiratory: CTA biL, good air movement without wheezing, rhonchi or crackled  Abdomen: soft, tender to palpation in the left upper quadrant. No rebound tenderness.   Skin: no rashes  Musculoskeletal: no peripheral edema; left CVA tenderness  Psychiatric: normal mood and affect  Neurologic: CN 2-12 grossly intact, MS 5/5 in all 4  Labs on Admission:  Basic Metabolic Panel:   Recent Labs Lab 04/03/13 0500  NA 132*  K 3.9  CL 96  CO2 27  GLUCOSE 121*  BUN 23  CREATININE 1.57*  CALCIUM 10.3   Liver Function Tests:  Recent Labs Lab 04/03/13 0500  AST 17  ALT 16  ALKPHOS 118*  BILITOT 0.8  PROT 7.9  ALBUMIN 4.2    Recent Labs Lab 04/03/13 0500  LIPASE 31   CBC:  Recent Labs Lab 04/03/13 0500  WBC 7.1  NEUTROABS 4.4  HGB 8.5*  HCT 25.4*  MCV 93.4  PLT 187   Cardiac Enzymes:  Recent Labs Lab 04/03/13 0500  TROPONINI <0.30   Radiological Exams on Admission: Ct Abdomen Pelvis W Contrast  04/03/2013   *RADIOLOGY REPORT*  Clinical Data: Abdominal pain.  CT ABDOMEN AND PELVIS WITH CONTRAST  Technique:  Multidetector CT imaging of the abdomen and pelvis was performed following the standard protocol during bolus administration of intravenous contrast.  Contrast: 50mL OMNIPAQUE IOHEXOL 300 MG/ML  SOLN, OMNIPAQUE IOHEXOL 300 MG/ML  SOLN  Comparison: 04/29/2007.  Findings:  BODY WALL: Unremarkable.  LOWER CHEST:  Mediastinum: Unremarkable.  Lungs/pleura: No consolidation.  ABDOMEN/PELVIS:  Liver: No focal abnormality.  Biliary: No evidence of biliary obstruction or stone.  Pancreas: Unremarkable.  Spleen: Unremarkable.  Adrenals: Diffuse enlargement indistinct  appearance of the adrenal gland, measuring at least 5 cm in length by 2.6 cm in thickness. The abnormal adrenal gland is in continuity with left renal vein thrombosis, and may be the source.  The thrombus extends across the midline into the IVC, up to the hepatic, infrahepatic junction. On delayed imaging, the thrombus appears low density, arguing against thrombus enhancement.  Kidneys and ureters: Despite the venous thrombosis, there is nearly symmetric renal enhancement, with slight retention the left.  2 left renal cysts of both around 3.2 cm in maximal dimension.  No hydronephrosis.  Hilar calcifications are likely arterial atherosclerotic.  Bladder: Unremarkable.  Bowel: No obstruction. Normal appendix.  Retroperitoneum: Retroperitoneal stranding, reactive to the thrombus.  Peritoneum: No free fluid or gas.  Reproductive: Serpiginous high density in the left spermatic cord, likely retained venous contrast in the  setting of downstream thrombosis.  Vascular: Left renal vein thrombosis as above, with extension into the IVC.  The caval thrombus extends from the level of the renal hila to the junction of the hepatic and infrahepatic cava.  The thrombus occupies over 50% of the luminal diameter.  Extensive aortic and branch vessel atherosclerotic  OSSEOUS: No acute abnormalities. Transitional lumbosacral anatomy. Status post interbody fusion at L4-5 and L5-L1, with laminectomies. L4-5 posterior rod and screw fixation.  Remote appearing L3 superior end plate fracture.  Critical Value/emergent results were called by telephone at the time of interpretation on 04/03/2013 at 07:10 a.m. to Dr Preston Fleeting, who verbally acknowledged these results.  IMPRESSION: Left renal vein thrombosis with extensive, nearly occlusive IVC thrombus.  Suspect causative mass in the enlarged left adrenal gland (versus edematous change from venous outflow obstruction). Recommend multiphase abdominal MRI.   Original Report Authenticated By: Tiburcio Pea    EKG: Independently reviewed.  Assessment/Plan Active Problems:   Peripheral vascular disease, unspecified   Hypertension   Renal vein thrombosis   Tobacco abuse   AKI (acute kidney injury)   Adrenal mass, left   Anemia   Renal vein thrombosis/left adrenal mass - CT scan shows diffuse enlargement of the left adrenal gland, for which a multiphase abdominal MRI is recommended. We'll transferred to [facility] to be able to obtain MRI today. Based on the MRI findings, to be considered biopsy of the adrenal gland.  - I discussed the case with nephrologist on call in the vascular surgeon on call and the mainstay of treatment right now is anticoagulation  AKI - creatinine 1.57, from previous normal renal function in 2009, suspect related to #1. Patient has good urine output. IV fluids.  Anemia - appears chronic. Reticulocytes, iron studies pending.   Peripheral vascular disease - stable, followed as an outpatient by vascular surgery  Tobacco abuse - counseled on cessation  DVT prophylaxis - currently on anticoagulation  Code Status: Presumed full  Family Communication: wife bedside  Disposition Plan: inpatient  Time spent: 120 min  Brightyn Mozer M. Elvera Lennox, MD [facility] Pager [phone]  If 7PM-7AM, please contact night-coverage [email or web address] Password [facility] 04/03/2013, 8:43 AM

## 2013-04-03 NOTE — ED Notes (Signed)
Report given to [facility] 

## 2013-04-03 NOTE — Progress Notes (Signed)
ANTICOAGULATION CONSULT NOTE - Initial Consult  Pharmacy Consult for Lovenox Indication: renal vein thrombosis   No Known Allergies  Patient Measurements: Height: 6\' 2"  (188 cm) Weight: 190 lb (86.183 kg) IBW/kg (Calculated) : 82.2  Vital Signs: Temp: 97.9 F (36.6 C) (08/03 0436) Temp src: Oral (08/03 0436) BP: 119/81 mmHg (08/03 0436) Pulse Rate: 97 (08/03 0436)  Labs:  Recent Labs  04/03/13 0500  HGB 8.5*  HCT 25.4*  PLT 187  CREATININE 1.57*  TROPONINI <0.30    Estimated Creatinine Clearance: 57.4 ml/min (by C-G formula based on Cr of 1.57).   Medical History: Past Medical History  Diagnosis Date  . Diabetes mellitus without complication   . Hypertension   . Arthritis 04/07/08    Degenerative Disc Disease  . Irritable bowel syndrome 04/07/08  . Glaucoma   . Peripheral vascular disease    Medications:   (Not in a hospital admission) Home meds reviewed.  Assessment: Okay for Protocol Initial Lovenox doses given in ED this AM.  Goal of Therapy:  Anti-Xa level 0.6-1.2 units/ml 4hrs after LMWH dose given if checked. Monitor platelets by anticoagulation protocol: Yes   Plan:  Lovenox 1mg /kg SQ every 12 hours. Monitor CBC and for signs/symptoms of bleeding. F/U long term anticoag plan.  Mady Gemma 04/03/2013,8:27 AM

## 2013-04-03 NOTE — ED Notes (Signed)
Pt now being sent to [facility]. Pt aware. Awaiting assignement.

## 2013-04-03 NOTE — ED Notes (Signed)
[id number]

## 2013-04-03 NOTE — Progress Notes (Signed)
Patient requested Dilaudid for generalized pain "all over" Given x1 per MAR. Patient appears comfortable in no acute distress. RN will continue to monitor. Vrishank Moster, RN  

## 2013-04-03 NOTE — Progress Notes (Signed)
ANTICOAGULATION CONSULT NOTE - Follow Up Consult  Pharmacy Consult for Lovenox Indication: renal vein thrombosis   No Known Allergies  Patient Measurements: Height: 6\' 2"  (188 cm) Weight: 187 lb 9.8 oz (85.1 kg) (scale A) IBW/kg (Calculated) : 82.2  Vital Signs: Temp: 98.7 F (37.1 C) (08/03 1800) Temp src: Oral (08/03 1800) BP: 100/61 mmHg (08/03 1800) Pulse Rate: 98 (08/03 1800)  Labs:  Recent Labs  04/03/13 0500 04/03/13 0902  HGB 8.5*  --   HCT 25.4*  --   PLT 187  --   LABPROT  --  14.2  INR  --  1.12  CREATININE 1.57*  --   TROPONINI <0.30  --     Estimated Creatinine Clearance: 57.4 ml/min (by C-G formula based on Cr of 1.57).   Medical History: Past Medical History  Diagnosis Date  . Diabetes mellitus without complication   . Hypertension   . Arthritis 04/07/08    Degenerative Disc Disease  . Irritable bowel syndrome 04/07/08  . Glaucoma   . Peripheral vascular disease    Medications:  Prescriptions prior to admission  Medication Sig Dispense Refill  . acidophilus (RISAQUAD) CAPS Take 1 capsule by mouth daily.      Marland Kitchen amLODipine (NORVASC) 10 MG tablet Take 10 mg by mouth daily.      . brimonidine (ALPHAGAN) 0.2 % ophthalmic solution Place 1 drop into both eyes 3 (three) times daily.      Marland Kitchen latanoprost (XALATAN) 0.005 % ophthalmic solution Place 1 drop into both eyes at bedtime.      Marland Kitchen losartan-hydrochlorothiazide (HYZAAR) 100-25 MG per tablet Take 1 tablet by mouth daily.      . metoCLOPramide (REGLAN) 10 MG tablet Take 10 mg by mouth 4 (four) times daily.        Assessment: 62yom with Hx PVD HTN, tobacco abuse admitted with left-sided abdominal pain.  CT showed left renal vein thrombus.  MRI shows IVC and left renal vein thrombosis as discussed above. This is most likely a primary process of IVC thrombosis subsequently occluding the left renal vein and left adrenal vein - per radiology.  Pt was started on treatment dose Lovenox 1mg /kg = 85mg  q12 at [facility]  prior to transfer.  Last dose at 0800 8/3, now MD wanting to change to heparin drip.  Due to last Lovenox dose 12hr ago and large clot - will bolus.     Goal of Therapy:  Heparin Level 0.3-0.7 Monitor platelets by anticoagulation protocol: Yes   Plan:  Heparin bolus 4000 uts IV x1  Heparin drip 1200 uts/hr  Daily CBC, HL  Leota Sauers Pharm.D. CPP, BCPS Clinical Pharmacist [phone] 04/03/2013 8:01 PM

## 2013-04-03 NOTE — ED Notes (Signed)
Patient transported to CT scan via stretcher.

## 2013-04-03 NOTE — ED Notes (Signed)
Patient states has been under a doctor's care for abdominal pain for several months due to taking too many antibiotics and it took the bacteria out of his stomach.  Patient also c/o chest pain that started 45 minutes ago.  Patient states he took a hydrocodone at 1am.  Patient denies nausea, vomiting and shortness of breath.

## 2013-04-03 NOTE — ED Provider Notes (Signed)
CSN: [id number]     Arrival date & time 04/03/13  0422 History     First MD Initiated Contact with Patient 04/03/13 0441     Chief Complaint  Patient presents with  . Abdominal Pain   (Consider location/radiation/quality/duration/timing/severity/associated sxs/prior Treatment) Patient is a 62 y.o. male presenting with abdominal pain. The history is provided by the patient.  Abdominal Pain Associated symptoms include abdominal pain.  He has been having problems with abdominal pain for the last 2 months following getting antibiotic from a dentist. This evening, he had a different type of abdominal pain which was left-sided and into the right lower quadrant and left flank and also radiating of the chest. This pain is sharp and he rates the pain a 9/10. It is worse with palpation worse with taking a deep breath. There's no associated nausea or vomiting and had no diarrhea. There is slight improvement of pain with passing flatus. He took a dose of hydrocodone with no relief of pain. He does have a history of peripheral vascular disease and still smokes half pack of cigarettes a day.  Past Medical History  Diagnosis Date  . Diabetes mellitus without complication   . Hypertension   . Arthritis 04/07/08    Degenerative Disc Disease  . Irritable bowel syndrome 04/07/08  . Glaucoma   . Peripheral vascular disease    Past Surgical History  Procedure Laterality Date  . Colonoscopy  01/11/11  . Femoral-tibial bypass graft  Aug. 7, 2009    Left  SFA to AFA   Family History  Problem Relation Age of Onset  . Congestive Heart Failure Mother   . Diabetes Mother   . Heart disease Mother     Coronary Disease  . Heart disease Father     Coronary Disease with Bypass Surgery  . Diabetes Paternal Aunt   . Stroke Paternal Aunt   . Diabetes Paternal Uncle   . Stroke Paternal Uncle    History  Substance Use Topics  . Smoking status: Current Every Day Smoker -- 0.50 packs/day    Types: Cigarettes  .  Smokeless tobacco: Never Used  . Alcohol Use: No    Review of Systems  Gastrointestinal: Positive for abdominal pain.  All other systems reviewed and are negative.    Allergies  Review of patient's allergies indicates no known allergies.  Home Medications   Current Outpatient Rx  Name  Route  Sig  Dispense  Refill  . acidophilus (RISAQUAD) CAPS   Oral   Take 1 capsule by mouth daily.         Marland Kitchen amLODipine (NORVASC) 10 MG tablet   Oral   Take 10 mg by mouth daily.         . brimonidine (ALPHAGAN) 0.2 % ophthalmic solution   Both Eyes   Place 1 drop into both eyes 3 (three) times daily.         Marland Kitchen latanoprost (XALATAN) 0.005 % ophthalmic solution   Both Eyes   Place into both eyes At bedtime.         Marland Kitchen losartan-hydrochlorothiazide (HYZAAR) 100-25 MG per tablet   Oral   Take 1 tablet by mouth daily.         . metoCLOPramide (REGLAN) 10 MG tablet   Oral   Take 10 mg by mouth 4 (four) times daily.         . Aliskiren-Hydrochlorothiazide (TEKTURNA HCT PO)   Oral   Take 300 mg by mouth daily.         Marland Kitchen  amLODipine-olmesartan (AZOR) 10-40 MG per tablet   Oral   Take 1 tablet by mouth daily.         Marland Kitchen aspirin 81 MG tablet   Oral   Take 81 mg by mouth daily.         Marland Kitchen buPROPion (WELLBUTRIN SR) 150 MG 12 hr tablet   Oral   Take 150 mg by mouth 2 (two) times daily.         . clopidogrel (PLAVIX) 75 MG tablet   Oral   Take 75 mg by mouth daily.         . nebivolol (BYSTOLIC) 10 MG tablet   Oral   Take 10 mg by mouth daily.         . sildenafil (VIAGRA) 25 MG tablet   Oral   Take 25 mg by mouth daily as needed.          BP 119/81  Pulse 97  Temp(Src) 97.9 F (36.6 C) (Oral)  Resp 18  Ht 6\' 2"  (1.88 m)  Wt 190 lb (86.183 kg)  BMI 24.38 kg/m2  SpO2 100% Physical Exam  Nursing note and vitals reviewed.  62 year old male, resting comfortably and in no acute distress. Vital signs are normal. Oxygen saturation is 100%, which is  normal. Head is normocephalic and atraumatic. PERRLA, EOMI. Oropharynx is clear. Neck is nontender and supple without adenopathy or JVD. Back is nontender in the midline. There is moderate left CVA tenderness. Lungs are clear without rales, wheezes, or rhonchi. Chest is nontender. Heart has regular rate and rhythm without murmur. Abdomen is soft, flat, with moderate tenderness diffusely. There is no rebound or guarding. There are no masses or hepatosplenomegaly and peristalsis is normoactive. Extremities have no cyanosis or edema, full range of motion is present. Skin is warm and dry without rash. Neurologic: Mental status is normal, cranial nerves are intact, there are no motor or sensory deficits.  ED Course   Procedures (including critical care time)  Results for orders placed during the hospital encounter of 04/03/13  CBC WITH DIFFERENTIAL      Result Value Range   WBC 7.1  4.0 - 10.5 K/uL   RBC 2.72 (*) 4.22 - 5.81 MIL/uL   Hemoglobin 8.5 (*) 13.0 - 17.0 g/dL   HCT 16.1 (*) 09.6 - 04.5 %   MCV 93.4  78.0 - 100.0 fL   MCH 31.3  26.0 - 34.0 pg   MCHC 33.5  30.0 - 36.0 g/dL   RDW 40.9  81.1 - 91.4 %   Platelets 187  150 - 400 K/uL   Neutrophils Relative % 62  43 - 77 %   Neutro Abs 4.4  1.7 - 7.7 K/uL   Lymphocytes Relative 21  12 - 46 %   Lymphs Abs 1.5  0.7 - 4.0 K/uL   Monocytes Relative 10  3 - 12 %   Monocytes Absolute 0.7  0.1 - 1.0 K/uL   Eosinophils Relative 6 (*) 0 - 5 %   Eosinophils Absolute 0.4  0.0 - 0.7 K/uL   Basophils Relative 1  0 - 1 %   Basophils Absolute 0.0  0.0 - 0.1 K/uL  COMPREHENSIVE METABOLIC PANEL      Result Value Range   Sodium 132 (*) 135 - 145 mEq/L   Potassium 3.9  3.5 - 5.1 mEq/L   Chloride 96  96 - 112 mEq/L   CO2 27  19 - 32 mEq/L   Glucose, Bld  121 (*) 70 - 99 mg/dL   BUN 23  6 - 23 mg/dL   Creatinine, Ser 0.96 (*) 0.50 - 1.35 mg/dL   Calcium 04.5  8.4 - 40.9 mg/dL   Total Protein 7.9  6.0 - 8.3 g/dL   Albumin 4.2  3.5 - 5.2 g/dL    AST 17  0 - 37 U/L   ALT 16  0 - 53 U/L   Alkaline Phosphatase 118 (*) 39 - 117 U/L   Total Bilirubin 0.8  0.3 - 1.2 mg/dL   GFR calc non Af Amer 46 (*) >90 mL/min   GFR calc Af Amer 53 (*) >90 mL/min  LIPASE, BLOOD      Result Value Range   Lipase 31  11 - 59 U/L  URINALYSIS, ROUTINE W REFLEX MICROSCOPIC      Result Value Range   Color, Urine YELLOW  YELLOW   APPearance CLEAR  CLEAR   Specific Gravity, Urine <1.005 (*) 1.005 - 1.030   pH 6.0  5.0 - 8.0   Glucose, UA NEGATIVE  NEGATIVE mg/dL   Hgb urine dipstick TRACE (*) NEGATIVE   Bilirubin Urine NEGATIVE  NEGATIVE   Ketones, ur NEGATIVE  NEGATIVE mg/dL   Protein, ur NEGATIVE  NEGATIVE mg/dL   Urobilinogen, UA 0.2  0.0 - 1.0 mg/dL   Nitrite NEGATIVE  NEGATIVE   Leukocytes, UA NEGATIVE  NEGATIVE  TROPONIN I      Result Value Range   Troponin I <0.30  <0.30 ng/mL  LACTIC ACID, PLASMA      Result Value Range   Lactic Acid, Venous 0.8  0.5 - 2.2 mmol/L  URINE MICROSCOPIC-ADD ON      Result Value Range   Squamous Epithelial / LPF RARE  RARE   RBC / HPF 0-2  <3 RBC/hpf   Bacteria, UA RARE  RARE   Ct Abdomen Pelvis W Contrast  04/03/2013   *RADIOLOGY REPORT*  Clinical Data: Abdominal pain.  CT ABDOMEN AND PELVIS WITH CONTRAST  Technique:  Multidetector CT imaging of the abdomen and pelvis was performed following the standard protocol during bolus administration of intravenous contrast.  Contrast: 50mL OMNIPAQUE IOHEXOL 300 MG/ML  SOLN, OMNIPAQUE IOHEXOL 300 MG/ML  SOLN  Comparison: 04/29/2007.  Findings:  BODY WALL: Unremarkable.  LOWER CHEST:  Mediastinum: Unremarkable.  Lungs/pleura: No consolidation.  ABDOMEN/PELVIS:  Liver: No focal abnormality.  Biliary: No evidence of biliary obstruction or stone.  Pancreas: Unremarkable.  Spleen: Unremarkable.  Adrenals: Diffuse enlargement indistinct appearance of the adrenal gland, measuring at least 5 cm in length by 2.6 cm in thickness. The abnormal adrenal gland is in continuity  with left renal vein thrombosis, and may be the source.  The thrombus extends across the midline into the IVC, up to the hepatic, infrahepatic junction. On delayed imaging, the thrombus appears low density, arguing against thrombus enhancement.  Kidneys and ureters: Despite the venous thrombosis, there is nearly symmetric renal enhancement, with slight retention the left.  2 left renal cysts of both around 3.2 cm in maximal dimension.  No hydronephrosis.  Hilar calcifications are likely arterial atherosclerotic.  Bladder: Unremarkable.  Bowel: No obstruction. Normal appendix.  Retroperitoneum: Retroperitoneal stranding, reactive to the thrombus.  Peritoneum: No free fluid or gas.  Reproductive: Serpiginous high density in the left spermatic cord, likely retained venous contrast in the setting of downstream thrombosis.  Vascular: Left renal vein thrombosis as above, with extension into the IVC.  The caval thrombus extends from the level of  the renal hila to the junction of the hepatic and infrahepatic cava.  The thrombus occupies over 50% of the luminal diameter.  Extensive aortic and branch vessel atherosclerotic  OSSEOUS: No acute abnormalities. Transitional lumbosacral anatomy. Status post interbody fusion at L4-5 and L5-L1, with laminectomies. L4-5 posterior rod and screw fixation.  Remote appearing L3 superior end plate fracture.  Critical Value/emergent results were called by telephone at the time of interpretation on 04/03/2013 at 07:10 a.m. to Dr Preston Fleeting, who verbally acknowledged these results.  IMPRESSION: Left renal vein thrombosis with extensive, nearly occlusive IVC thrombus.  Suspect causative mass in the enlarged left adrenal gland (versus edematous change from venous outflow obstruction). Recommend multiphase abdominal MRI.   Original Report Authenticated By: Tiburcio Pea   Images viewed by me, discussed with radiologist.  ECG shows normal sinus rhythm with PVCs with a rate of 94, no ectopy.  Normal axis. Normal P wave. Normal QRS. Normal intervals. Normal ST and T waves. Impression: normal ECG with occasional PVC. When compared with ECG of 03/09/2008, no significant changes are seen.  1. Abdominal pain   2. Anemia   3. Renal insufficiency   4. Hyponatremia   5. Thrombosis of renal vein     MDM  Abdominal pain of uncertain cause. Screening labs will be obtained and he'll be sent for CT scan. Also I will check urinalysis, ECG, and troponin.  Laboratory workup is significant for renal insufficiency which is new. CT scan shows evidence of left renal vein thrombosis with thrombus extending into the inferior vena cava. He is started on enoxaparin as an anticoagulant and will need to be admitted. Cause of the renal vein thrombosis is unclear. Case is discussed with Dr. Lafe Garin, on call for [facility]ad hospitalist, who agrees to admit the patient.  Dione Booze, MD 04/03/13 [phone]

## 2013-04-03 NOTE — Progress Notes (Signed)
Text Page to DR Rito Ehrlich to make aware that pt is here. MRI of ABD/Pelvis already ordered. Awaiting test

## 2013-04-03 NOTE — Progress Notes (Signed)
Pt arrived from [facility] per [facility] ambulance. Alert/oriented. Oriented to room. Vs done and placed on Telemonitor.

## 2013-04-03 NOTE — ED Notes (Signed)
Pt has had approx of urinary o/p since 7am.

## 2013-04-04 DIAGNOSIS — E279 Disorder of adrenal gland, unspecified: Secondary | ICD-10-CM

## 2013-04-04 DIAGNOSIS — D649 Anemia, unspecified: Secondary | ICD-10-CM

## 2013-04-04 DIAGNOSIS — N179 Acute kidney failure, unspecified: Secondary | ICD-10-CM

## 2013-04-04 DIAGNOSIS — I823 Embolism and thrombosis of renal vein: Secondary | ICD-10-CM

## 2013-04-04 DIAGNOSIS — I739 Peripheral vascular disease, unspecified: Secondary | ICD-10-CM

## 2013-04-04 LAB — CBC
MCV: 92.5 fL (ref 78.0–100.0)
Platelets: 203 10*3/uL (ref 150–400)
RDW: 13.7 % (ref 11.5–15.5)
WBC: 6.8 10*3/uL (ref 4.0–10.5)

## 2013-04-04 LAB — HEPARIN LEVEL (UNFRACTIONATED)
Heparin Unfractionated: 0.42 IU/mL (ref 0.30–0.70)
Heparin Unfractionated: 0.52 IU/mL (ref 0.30–0.70)

## 2013-04-04 MED ORDER — HYDROMORPHONE HCL PF 1 MG/ML IJ SOLN
1.0000 mg | INTRAMUSCULAR | Status: DC | PRN
  Administered 2013-04-04 – 2013-04-06 (×10): 1 mg via INTRAVENOUS
  Filled 2013-04-04 (×11): qty 1

## 2013-04-04 MED ORDER — HEPARIN BOLUS VIA INFUSION
1200.0000 [IU] | Freq: Once | INTRAVENOUS | Status: AC
  Administered 2013-04-04: 1200 [IU] via INTRAVENOUS
  Filled 2013-04-04: qty 1200

## 2013-04-04 NOTE — Progress Notes (Signed)
PHARMACY FOLLOW UP NOTE  Pharmacy Consult for : Heparin  Indication:  IVC and renal vein thrombosis  Dosing Weight: 85 kg  Labs:  Recent Labs  04/03/13 0500 04/03/13 0902 04/04/13 0415 04/04/13 1023 04/04/13 1827  HGB 8.5*  --  8.6*  --   --   HCT 25.4*  --  25.9*  --   --   PLT 187  --  203  --   --   LABPROT  --  14.2  --   --   --   INR  --  1.12  --   --   --   HEPARINUNFRC  --   --  0.42 0.29* 0.52  CREATININE 1.57*  --   --   --   --     Infusions:  . heparin 1,400 Units/hr (04/04/13 1430)   Assessment:  62 y/o male on a heparin drip for IVC and renal vein thrombosis  Heparin rate 1400 units/hr.  Heparin level within therapeutic range, 0.52 units/ml  No bleeding complications noted   Goal of Therapy:  Heparin level 0.3-0.7 units/ml   Plan:  Continue Heparin infusion at current rate, 1400 units/hr.  Daily Heparin level and CBC while on Heparin. Follow platelets carefully.   Rey Fors, Deetta Perla.D 04/04/2013, 7:10 PM

## 2013-04-04 NOTE — Progress Notes (Signed)
ANTICOAGULATION CONSULT NOTE - Follow Up Consult  Pharmacy Consult for heparin Indication: renal vein thrombosis  Labs:  Recent Labs  04/03/13 0500 04/03/13 0902 04/04/13 0415  HGB 8.5*  --  8.6*  HCT 25.4*  --  25.9*  PLT 187  --  203  LABPROT  --  14.2  --   INR  --  1.12  --   HEPARINUNFRC  --   --  0.42  CREATININE 1.57*  --   --   TROPONINI <0.30  --   --      Assessment/Plan:  62yo male therapeutic on heparin with initial dosing for thrombosis after being switched from Lovenox.  Will continue gtt at current rate and confirm stable with additional level.  Vernard Gambles, PharmD, BCPS  04/04/2013,5:40 AM

## 2013-04-04 NOTE — Progress Notes (Signed)
ANTICOAGULATION CONSULT NOTE - Follow Up Consult  Pharmacy Consult for Heparin Indication: IVC and renal vein thrombosis  No Known Allergies  Patient Measurements: Height: 6\' 2"  (188 cm) Weight: 187 lb 6.3 oz (85 kg) (scale A) IBW/kg (Calculated) : 82.2 Heparin Dosing Weight: 85 kg  Vital Signs: Temp: 98 F (36.7 C) (08/04 0549) Temp src: Oral (08/04 0549) BP: 142/73 mmHg (08/04 0549) Pulse Rate: 88 (08/04 0549)  Labs:  Recent Labs  04/03/13 0500 04/03/13 0902 04/04/13 0415 04/04/13 1023  HGB 8.5*  --  8.6*  --   HCT 25.4*  --  25.9*  --   PLT 187  --  203  --   LABPROT  --  14.2  --   --   INR  --  1.12  --   --   HEPARINUNFRC  --   --  0.42 0.29*  CREATININE 1.57*  --   --   --   TROPONINI <0.30  --   --   --     Estimated Creatinine Clearance: 57.4 ml/min (by C-G formula based on Cr of 1.57).   Medications:  Infusions:  . sodium chloride 75 mL/hr at 04/04/13 1006  . heparin 1,200 Units/hr (04/03/13 2059)    Assessment: 62 y/o male on a heparin drip for IVC and renal vein thrombosis. Heparin level is subtherapeutic at 0.29 on 1200 units/hr. No bleeding noted, Hb is low but stable, platelets are wnl. Spoke with RN and no problems with infusion.  Goal of Therapy:  Heparin level 0.3-0.7 units/ml Monitor platelets by anticoagulation protocol: Yes   Plan:  -Heparin 1200 units IV bolus then increase the infusion to 1400 units/hr -Heparin level 6 hours after rate change -Daily heparin level and CBC while on heparin -Monitor for signs/symptoms of bleeding  [facility], [address].D., BCPS Clinical Pharmacist Pager: [phone] 04/04/2013 11:36 AM

## 2013-04-04 NOTE — Progress Notes (Signed)
[facility] Progress note  [name] ZOX:[id number] DOB: 07-09-1951 DOA: 04/03/2013  Referring physician: Dr. Preston Fleeting PCP: Geraldo Pitter, MD  Specialists: none  Chief Complaint: abdominal pain  HPI: [name] is a 62 y.o. male has a past medical history significant for peripheral vascular disease s/p superficial femoral artery to anterior tibial artery bypass with Dr. Madilyn Fireman in 2009, hypertension, tobacco abuse, presents to the emergency room with a chief complaint of left-sided abdominal pain. He's had some nonspecific abdominal pain on and off for the past couple months, and patient thinks that all of this started when he got a tooth extraction followed by several courses of antibiotics for infection. He feels like his abdominal pain may have gotten a little bit worse in the past couple weeks, however last night he feels like he had a different type of abdominal pain into the left lower quadrant and going into his left flank. It is pleuritic in nature and rates it as severe (9/10). He denies any nausea, vomiting or diarrhea. he also describes on and off right-sided chest pain. He denies any shortness of breath. He denies any lightheadedness or dizziness. In the emergency room, patient underwent a CT of the abdomen and pelvis which showed left renal vein thrombosis. Seen by vascular  & vein specialists of [facility] 10/04/2012 per their note Dr. Edilia Bo and Dr. Madilyn Fireman in 2009 performed a left superficial femoral artery to anterior tibial artery bypass    Review of Systems: As per history of present illness, otherwise negative  Past Medical History  Diagnosis Date  . Diabetes mellitus without complication   . Hypertension   . Arthritis 04/07/08    Degenerative Disc Disease  . Irritable bowel syndrome 04/07/08  . Glaucoma   . Peripheral vascular disease    Past Surgical History  Procedure Laterality Date  . Colonoscopy  01/11/11  . Femoral-tibial bypass graft  Aug. 7, 2009    Left   SFA to AFA  . Back surgery  2005   Social History:  reports that he has been smoking Cigarettes.  He has been smoking about 0.50 packs per day. He has never used smokeless tobacco. He reports that he uses illicit drugs (Hydrocodone and Other-see comments) about once per week. His alcohol history is not on file.  No Known Allergies  Family History  Problem Relation Age of Onset  . Congestive Heart Failure Mother   . Diabetes Mother   . Heart disease Mother     Coronary Disease  . Heart disease Father     Coronary Disease with Bypass Surgery  . Diabetes Paternal Aunt   . Stroke Paternal Aunt   . Diabetes Paternal Uncle   . Stroke Paternal Uncle     Prior to Admission medications   Medication Sig Start Date End Date Taking? Authorizing Provider  acidophilus (RISAQUAD) CAPS Take 1 capsule by mouth daily.   Yes Historical Provider, MD  amLODipine (NORVASC) 10 MG tablet Take 10 mg by mouth daily.   Yes Historical Provider, MD  brimonidine (ALPHAGAN) 0.2 % ophthalmic solution Place 1 drop into both eyes 3 (three) times daily.   Yes Historical Provider, MD  latanoprost (XALATAN) 0.005 % ophthalmic solution Place into both eyes At bedtime. 08/23/12  Yes Historical Provider, MD  losartan-hydrochlorothiazide (HYZAAR) 100-25 MG per tablet Take 1 tablet by mouth daily.   Yes Historical Provider, MD  metoCLOPramide (REGLAN) 10 MG tablet Take 10 mg by mouth 4 (four) times daily.   Yes Historical  Provider, MD  Aliskiren-Hydrochlorothiazide (TEKTURNA HCT PO) Take 300 mg by mouth daily.    Historical Provider, MD  amLODipine-olmesartan (AZOR) 10-40 MG per tablet Take 1 tablet by mouth daily.    Historical Provider, MD  aspirin 81 MG tablet Take 81 mg by mouth daily.    Historical Provider, MD  buPROPion (WELLBUTRIN SR) 150 MG 12 hr tablet Take 150 mg by mouth 2 (two) times daily.    Historical Provider, MD  clopidogrel (PLAVIX) 75 MG tablet Take 75 mg by mouth daily.    Historical Provider, MD   nebivolol (BYSTOLIC) 10 MG tablet Take 10 mg by mouth daily.    Historical Provider, MD  sildenafil (VIAGRA) 25 MG tablet Take 25 mg by mouth daily as needed.    Historical Provider, MD   Physical Exam: Filed Vitals:   04/03/13 1033 04/03/13 1800 04/03/13 2120 04/04/13 0549  BP: 131/70 100/61 119/57 142/73  Pulse: 111 98 87 88  Temp: 98.9 F (37.2 C) 98.7 F (37.1 C) 98.3 F (36.8 C) 98 F (36.7 C)  TempSrc: Oral Oral Oral Oral  Resp: 20 20 20 20   Height: 6\' 2"  (1.88 m)     Weight: 85.1 kg (187 lb 9.8 oz)   85 kg (187 lb 6.3 oz)  SpO2: 100% 100% 99% 100%     General: Alert,NAD  Cardiovascular: Regular rhythm and rate, negative murmurs rubs or gallops, DP/PT pulse 2+ bilateral Respiratory: CTA biL, good air movement without wheezing, rhonchi or crackled  Abdomen: soft, tender to palpation in the left upper quadrant. No rebound tenderness.   Skin: no rashes  Musculoskeletal: no peripheral edema; left CVA tenderness   Labs on Admission:  Basic Metabolic Panel:   Recent Labs Lab 04/03/13 0500  NA 132*  K 3.9  CL 96  CO2 27  GLUCOSE 121*  BUN 23  CREATININE 1.57*  CALCIUM 10.3   Liver Function Tests:  Recent Labs Lab 04/03/13 0500  AST 17  ALT 16  ALKPHOS 118*  BILITOT 0.8  PROT 7.9  ALBUMIN 4.2    Recent Labs Lab 04/03/13 0500  LIPASE 31   CBC:  Recent Labs Lab 04/03/13 0500 04/04/13 0415  WBC 7.1 6.8  NEUTROABS 4.4  --   HGB 8.5* 8.6*  HCT 25.4* 25.9*  MCV 93.4 92.5  PLT 187 203   Cardiac Enzymes:  Recent Labs Lab 04/03/13 0500  TROPONINI <0.30   Radiological Exams on Admission: Mr Abdomen W Wo Contrast  04/03/2013   *RADIOLOGY REPORT*  Clinical Data: 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 Enlarged left adrenal gland.  Assess for possible adrenal mass  MRI ABDOMEN WITH AND WITHOUT CONTRAST  Technique:  Multiplanar multisequence MR imaging of the abdomen was performed both before and after administration of intravenous  contrast.  Contrast: 20mL MULTIHANCE GADOBENATE DIMEGLUMINE 529 MG/ML IV SOLN  Comparison: CT scan 04/03/2013.  Findings: As demonstrated on the CT scan there is extensive occlusive clot in the IVC.  This begins at the L3 level and extends into the intrahepatic IVC.  It ends approximately 3 cm below the right hemi diaphragm.  There is also extensive obstructing thrombus in the left renal vein.  No worrisome left renal mass to suggest renal cell carcinoma.  There are a few simple appearing left renal cyst.  The left adrenal gland is markedly enlarged and appears edematous but maintains it's adreniform shape.  No worrisome enhancement, discrete mass or signal loss on the out of phase images.  I  suspect the enlargement is due to venous obstruction and engorgement.  The liver is unremarkable.  No focal hepatic lesions or intrahepatic biliary dilatation.  The hepatic veins are patent. The portal vein is patent.  The gallbladder is normal.  No common bile duct dilatation.  The pancreas is normal.  The spleen is normal.  Advanced atherosclerotic calcification involving the aorta. The major branch vessels are patent.  No mesenteric or retroperitoneal mass or adenopathy.  Fairly extensive left perirenal collateral vessels are noted.  IMPRESSION:  1.  IVC and left renal vein thrombosis as discussed above.  This is most likely a primary process of IVC thrombosis subsequently occluding the left renal vein and left adrenal vein.  The left adrenal gland is enlarged most likely due to venous obstruction and engorgement.  No definite mass.  Recommend treatment for the IVC thrombosis and reassessment of the adrenal gland after the thrombosis has resolved. 2.  Renal cysts but no renal mass lesions. 3.  Patent right renal vein.   Original Report Authenticated By: Rudie Meyer, M.D.   Ct Abdomen Pelvis W Contrast  04/03/2013   *RADIOLOGY REPORT*  Clinical Data: Abdominal pain.  CT ABDOMEN AND PELVIS WITH CONTRAST  Technique:   Multidetector CT imaging of the abdomen and pelvis was performed following the standard protocol during bolus administration of intravenous contrast.  Contrast: 50mL OMNIPAQUE IOHEXOL 300 MG/ML  SOLN, OMNIPAQUE IOHEXOL 300 MG/ML  SOLN  Comparison: 04/29/2007.  Findings:  BODY WALL: Unremarkable.  LOWER CHEST:  Mediastinum: Unremarkable.  Lungs/pleura: No consolidation.  ABDOMEN/PELVIS:  Liver: No focal abnormality.  Biliary: No evidence of biliary obstruction or stone.  Pancreas: Unremarkable.  Spleen: Unremarkable.  Adrenals: Diffuse enlargement indistinct appearance of the adrenal gland, measuring at least 5 cm in length by 2.6 cm in thickness. The abnormal adrenal gland is in continuity with left renal vein thrombosis, and may be the source.  The thrombus extends across the midline into the IVC, up to the hepatic, infrahepatic junction. On delayed imaging, the thrombus appears low density, arguing against thrombus enhancement.  Kidneys and ureters: Despite the venous thrombosis, there is nearly symmetric renal enhancement, with slight retention the left.  2 left renal cysts of both around 3.2 cm in maximal dimension.  No hydronephrosis.  Hilar calcifications are likely arterial atherosclerotic.  Bladder: Unremarkable.  Bowel: No obstruction. Normal appendix.  Retroperitoneum: Retroperitoneal stranding, reactive to the thrombus.  Peritoneum: No free fluid or gas.  Reproductive: Serpiginous high density in the left spermatic cord, likely retained venous contrast in the setting of downstream thrombosis.  Vascular: Left renal vein thrombosis as above, with extension into the IVC.  The caval thrombus extends from the level of the renal hila to the junction of the hepatic and infrahepatic cava.  The thrombus occupies over 50% of the luminal diameter.  Extensive aortic and branch vessel atherosclerotic  OSSEOUS: No acute abnormalities. Transitional lumbosacral anatomy. Status post interbody fusion at L4-5 and  L5-L1, with laminectomies. L4-5 posterior rod and screw fixation.  Remote appearing L3 superior end plate fracture.  Critical Value/emergent results were called by telephone at the time of interpretation on 04/03/2013 at 07:10 a.m. to Dr Preston Fleeting, who verbally acknowledged these results.  IMPRESSION: Left renal vein thrombosis with extensive, nearly occlusive IVC thrombus.  Suspect causative mass in the enlarged left adrenal gland (versus edematous change from venous outflow obstruction). Recommend multiphase abdominal MRI.   Original Report Authenticated By: Tiburcio Pea    EKG: Independently reviewed.  Assessment/Plan  Active Problems:   Peripheral vascular disease, unspecified   Hypertension   Renal vein thrombosis   Tobacco abuse   AKI (acute kidney injury)   Adrenal mass, left   Anemia   Renal vein thrombosis/left adrenal mass - CT scan shows diffuse enlargement of the left adrenal gland, for which a multiphase abdominal MRI is recommended. We'll transferred to [facility] to be able to obtain MRI today. Based on the MRI findings, to be considered biopsy of the adrenal gland.  - I discussed the case with nephrologist on call in the vascular surgeon on call and the mainstay of treatment right now is anticoagulation  1. Renal vein thrombus; patient had spoken with length with Dr. Fredia Sorrow (IR) and we decided that I will do workup for pheochromocytoma, and after labs return decision will be made on to how best to approach this inflammation/mass in the left adrenal gland.   2. AKI - creatinine 1.57, from previous normal renal function in 2009, suspect related to #1. Patient has good urine output. Continue IV fluids.  3. Anemia - appears chronic. Reticulocytes, iron studies pending.   4. Peripheral vascular disease - stable, followed as an outpatient by vascular surgery  5. Tobacco abuse -counseled at length patient and wife about the need to stop smoking cigarettes as well as  marijuana considering the peripheral vascular issues patient suffering   DVT prophylaxis - continue heparin drip for anticoagulation  Code Status: full  Family Communication: wife bedside  Disposition Plan: inpatient  Time spent: 60 min  Carolyne Littles, MD [facility] Pager [phone] If 7PM-7AM, please contact night-coverage [email or web address] Password [email or web address] 04/04/2013, 10:54 AM

## 2013-04-05 DIAGNOSIS — I739 Peripheral vascular disease, unspecified: Secondary | ICD-10-CM

## 2013-04-05 LAB — CREATININE CLEARANCE, URINE, 24 HOUR
Creatinine: 1.09 mg/dL (ref 0.50–1.35)
Urine Total Volume-CRCL: 2300 mL

## 2013-04-05 LAB — CORTISOL-AM, BLOOD: Cortisol - AM: 16.6 ug/dL (ref 4.3–22.4)

## 2013-04-05 LAB — CBC
HCT: 26.2 % — ABNORMAL LOW (ref 39.0–52.0)
Hemoglobin: 8.7 g/dL — ABNORMAL LOW (ref 13.0–17.0)
MCH: 30.6 pg (ref 26.0–34.0)
MCHC: 33.2 g/dL (ref 30.0–36.0)
RBC: 2.84 MIL/uL — ABNORMAL LOW (ref 4.22–5.81)
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
## 2013-04-05 NOTE — Progress Notes (Signed)
Patient requested Dilaudid for generalized pain "all over" Given x1 per MAR. Patient appears comfortable in no acute distress. RN will continue to monitor. Louretta Parma, RN

## 2013-04-05 NOTE — Plan of Care (Signed)
Problem: Phase I Progression Outcomes Goal: Tolerating diet Outcome: Progressing Pt is tolerating diet well. Pt is not complaining of any N/V/D. Will continue to monitor

## 2013-04-05 NOTE — Progress Notes (Signed)
ANTICOAGULATION CONSULT NOTE - Follow Up Consult  Pharmacy Consult for Heparin Indication: IVC and renal vein thrombosis  No Known Allergies  Patient Measurements: Height: 6\' 2"  (188 cm) Weight: 186 lb 9.6 oz (84.641 kg) (Scale A) IBW/kg (Calculated) : 82.2 Heparin Dosing Weight: 85 kg  Vital Signs: Temp: 98.2 F (36.8 C) (08/05 0438) Temp src: Oral (08/05 0438) BP: 147/74 mmHg (08/05 0438) Pulse Rate: 98 (08/05 0438)  Labs:  Recent Labs  04/03/13 0500 04/03/13 0902  04/04/13 0415 04/04/13 1023 04/04/13 1827 04/05/13 0500  HGB 8.5*  --   --  8.6*  --   --  8.7*  HCT 25.4*  --   --  25.9*  --   --  26.2*  PLT 187  --   --  203  --   --  216  LABPROT  --  14.2  --   --   --   --   --   INR  --  1.12  --   --   --   --   --   HEPARINUNFRC  --   --   < > 0.42 0.29* 0.52 0.53  CREATININE 1.57*  --   --   --   --   --   --   TROPONINI <0.30  --   --   --   --   --   --   < > = values in this interval not displayed.  Estimated Creatinine Clearance: 57.4 ml/min (by C-G formula based on Cr of 1.57).   Medications:  Infusions:  . sodium chloride 75 mL/hr at 04/04/13 1006  . heparin 1,400 Units/hr (04/05/13 1002)    Assessment: 62 y/o male on a heparin drip for IVC and renal vein thrombosis. Heparin level is therapeutic at 0.53 on 1400 units/hr. No bleeding noted, Hb is low but stable, platelets are wnl.  Goal of Therapy:  Heparin level 0.3-0.7 units/ml Monitor platelets by anticoagulation protocol: Yes   Plan:  -Continue heparin drip at 1400 units/hr -Daily heparin level and CBC -Monitor for signs/symptoms of bleeding  [facility], [place].D., BCPS Clinical Pharmacist Pager: [phone] 04/05/2013 12:52 PM

## 2013-04-05 NOTE — Progress Notes (Signed)
Pt completed the 24 urine specimen. Pt has had continuous pain throughout the day. Pt is receiving 1 mg of IV Diluadid for his pain. Pt states that his pain is in his left side.

## 2013-04-06 LAB — HEPARIN LEVEL (UNFRACTIONATED): Heparin Unfractionated: 0.55 IU/mL (ref 0.30–0.70)

## 2013-04-06 LAB — CBC
HCT: 26.4 % — ABNORMAL LOW (ref 39.0–52.0)
Hemoglobin: 8.8 g/dL — ABNORMAL LOW (ref 13.0–17.0)
MCHC: 33.3 g/dL (ref 30.0–36.0)
MCV: 92.3 fL (ref 78.0–100.0)

## 2013-04-06 MED ORDER — HYDROMORPHONE HCL PF 1 MG/ML IJ SOLN
1.0000 mg | INTRAMUSCULAR | Status: DC | PRN
  Administered 2013-04-06 – 2013-04-08 (×10): 1 mg via INTRAVENOUS
  Filled 2013-04-06 (×10): qty 1

## 2013-04-06 NOTE — Progress Notes (Signed)
Pt has had an uneventful shift. Pt has been complaining of continuous pain in his left side. Pt has been receiving IV Diluadid PRN (See MAR) for his pain. Pt is in no other distress. Vital signs have been stable.

## 2013-04-06 NOTE — Progress Notes (Signed)
Received patient alert and oriented x 4. On room air, not in distress. Complained of abdominal pain, with pain scale of 9/10. PRN Dilaudid IV was given as ordered. Other assessment, please see flow sheet. Will monitor accordingly.

## 2013-04-06 NOTE — Progress Notes (Signed)
Pt. Alert and oriented this am. No s/s of distress or discomfort noted. Pt. C/o of 9/10 constant, aching pain to left abdomen. PRN pain medication administered as ordered and effective. RN will continue to monitor pt. For changes in condition.Blood pressure 140/70, pulse 97, temperature 98.6 F (37 C), temperature source Oral, resp. rate 20, height 6\' 2"  (1.88 m), weight 85.14 kg (187 lb 11.2 oz), SpO2 100.00%.  [name], Cheryll Dessert

## 2013-04-06 NOTE — Progress Notes (Signed)
ANTICOAGULATION CONSULT NOTE - Follow Up Consult  Pharmacy Consult for Heparin Indication: IVC and renal vein thrombosis  No Known Allergies  Patient Measurements: Height: 6\' 2"  (188 cm) Weight: 187 lb 11.2 oz (85.14 kg) (Scale A) IBW/kg (Calculated) : 82.2 Heparin Dosing Weight: 85 kg  Vital Signs: Temp: 98.6 F (37 C) (08/06 0457) Temp src: Oral (08/06 0457) BP: 140/70 mmHg (08/06 0457) Pulse Rate: 97 (08/06 0457)  Labs:  Recent Labs  04/04/13 0415 04/04/13 1023 04/04/13 1733 04/04/13 1827 04/05/13 0500 04/06/13 0410  HGB 8.6*  --   --   --  8.7* 8.8*  HCT 25.9*  --   --   --  26.2* 26.4*  PLT 203  --   --   --  216 227  HEPARINUNFRC 0.42 0.29*  --  0.52 0.53 0.55  CREATININE  --   --  1.09  --   --   --     Estimated Creatinine Clearance: 82.7 ml/min (by C-G formula based on Cr of 1.09).   Medications:  Infusions:  . sodium chloride 75 mL/hr at 04/04/13 1006  . heparin 1,400 Units/hr (04/06/13 1610)    Assessment: 62 y/o male on a heparin drip for IVC and renal vein thrombosis. Heparin level is therapeutic at 0.55 on 1400 units/hr. No bleeding noted, Hb is low but stable, platelets are wnl.  Goal of Therapy:  Heparin level 0.3-0.7 units/ml Monitor platelets by anticoagulation protocol: Yes   Plan:  -Continue heparin drip at 1400 units/hr -Daily heparin level and CBC -Monitor for signs/symptoms of bleeding -MD, what are long-term plans for anticoagulation?  [place], [address].D., BCPS Clinical Pharmacist Pager: [phone] 04/06/2013 9:53 AM

## 2013-04-06 NOTE — Progress Notes (Addendum)
[facility] Progress note  PAUL TRETTIN WUJ:[id number] DOB: 09-Dec-1950 DOA: 04/03/2013  Referring physician: Dr. Preston Fleeting PCP: Geraldo Pitter, MD  Specialists: none  Chief Complaint: abdominal pain  HPI: [name] is a 62 y.o. male has a past medical history significant for peripheral vascular disease s/p superficial femoral artery to anterior tibial artery bypass with Dr. Madilyn Fireman in 2009, hypertension, tobacco abuse, presents to the emergency room with a chief complaint of left-sided abdominal pain. He's had some nonspecific abdominal pain on and off for the past couple months, and patient thinks that all of this started when he got a tooth extraction followed by several courses of antibiotics for infection. He feels like his abdominal pain may have gotten a little bit worse in the past couple weeks, however last night he feels like he had a different type of abdominal pain into the left lower quadrant and going into his left flank. It is pleuritic in nature and rates it as severe (9/10). He denies any nausea, vomiting or diarrhea. he also describes on and off right-sided chest pain. He denies any shortness of breath. He denies any lightheadedness or dizziness. In the emergency room, patient underwent a CT of the abdomen and pelvis which showed left renal vein thrombosis. Seen by vascular  & vein specialists of [facility] 10/04/2012 per their note Dr. Edilia Bo and Dr. Madilyn Fireman in 2009 performed a left superficial femoral artery to anterior tibial artery bypass   Having abdominal pain still  Review of Systems: As per history of present illness, otherwise negative  Past Medical History  Diagnosis Date  . Diabetes mellitus without complication   . Hypertension   . Arthritis 04/07/08    Degenerative Disc Disease  . Irritable bowel syndrome 04/07/08  . Glaucoma   . Peripheral vascular disease    Past Surgical History  Procedure Laterality Date  . Colonoscopy  01/11/11  . Femoral-tibial bypass  graft  Aug. 7, 2009    Left  SFA to AFA  . Back surgery  2005   Social History:  reports that he has been smoking Cigarettes.  He has been smoking about 0.50 packs per day. He has never used smokeless tobacco. He reports that he uses illicit drugs (Hydrocodone and Other-see comments) about once per week. His alcohol history is not on file.  No Known Allergies  Family History  Problem Relation Age of Onset  . Congestive Heart Failure Mother   . Diabetes Mother   . Heart disease Mother     Coronary Disease  . Heart disease Father     Coronary Disease with Bypass Surgery  . Diabetes Paternal Aunt   . Stroke Paternal Aunt   . Diabetes Paternal Uncle   . Stroke Paternal Uncle     Prior to Admission medications   Medication Sig Start Date End Date Taking? Authorizing Provider  acidophilus (RISAQUAD) CAPS Take 1 capsule by mouth daily.   Yes Historical Provider, MD  amLODipine (NORVASC) 10 MG tablet Take 10 mg by mouth daily.   Yes Historical Provider, MD  brimonidine (ALPHAGAN) 0.2 % ophthalmic solution Place 1 drop into both eyes 3 (three) times daily.   Yes Historical Provider, MD  latanoprost (XALATAN) 0.005 % ophthalmic solution Place into both eyes At bedtime. 08/23/12  Yes Historical Provider, MD  losartan-hydrochlorothiazide (HYZAAR) 100-25 MG per tablet Take 1 tablet by mouth daily.   Yes Historical Provider, MD  metoCLOPramide (REGLAN) 10 MG tablet Take 10 mg by mouth 4 (four) times daily.  Yes Historical Provider, MD  Aliskiren-Hydrochlorothiazide (TEKTURNA HCT PO) Take 300 mg by mouth daily.    Historical Provider, MD  amLODipine-olmesartan (AZOR) 10-40 MG per tablet Take 1 tablet by mouth daily.    Historical Provider, MD  aspirin 81 MG tablet Take 81 mg by mouth daily.    Historical Provider, MD  buPROPion (WELLBUTRIN SR) 150 MG 12 hr tablet Take 150 mg by mouth 2 (two) times daily.    Historical Provider, MD  clopidogrel (PLAVIX) 75 MG tablet Take 75 mg by mouth daily.     Historical Provider, MD  nebivolol (BYSTOLIC) 10 MG tablet Take 10 mg by mouth daily.    Historical Provider, MD  sildenafil (VIAGRA) 25 MG tablet Take 25 mg by mouth daily as needed.    Historical Provider, MD   Physical Exam: Filed Vitals:   04/05/13 1412 04/05/13 1756 04/05/13 2014 04/06/13 0457  BP: 135/68 129/67 136/60 140/70  Pulse: 102 101 100 97  Temp: 98.9 F (37.2 C) 99.1 F (37.3 C) 98.7 F (37.1 C) 98.6 F (37 C)  TempSrc: Oral Oral Oral Oral  Resp: 19 18 20 20   Height:      Weight:    85.14 kg (187 lb 11.2 oz)  SpO2: 98% 98% 100% 100%     General: Alert,NAD  Cardiovascular: Regular rhythm and rate, negative murmurs rubs or gallops, DP/PT pulse 2+ bilateral Respiratory: CTA biL, good air movement without wheezing, rhonchi or crackled  Abdomen: soft, tender to palpation in the left upper quadrant. No rebound tenderness.   Skin: no rashes  Musculoskeletal: no peripheral edema; left CVA tenderness   Labs on Admission:  Basic Metabolic Panel:   Recent Labs Lab 04/03/13 0500 04/04/13 1733  NA 132*  --   K 3.9  --   CL 96  --   CO2 27  --   GLUCOSE 121*  --   BUN 23  --   CREATININE 1.57* 1.09  CALCIUM 10.3  --    Liver Function Tests:  Recent Labs Lab 04/03/13 0500  AST 17  ALT 16  ALKPHOS 118*  BILITOT 0.8  PROT 7.9  ALBUMIN 4.2    Recent Labs Lab 04/03/13 0500  LIPASE 31   CBC:  Recent Labs Lab 04/03/13 0500 04/04/13 0415 04/05/13 0500 04/06/13 0410  WBC 7.1 6.8 6.4 6.6  NEUTROABS 4.4  --   --   --   HGB 8.5* 8.6* 8.7* 8.8*  HCT 25.4* 25.9* 26.2* 26.4*  MCV 93.4 92.5 92.3 92.3  PLT 187 203 216 227   Cardiac Enzymes:  Recent Labs Lab 04/03/13 0500  TROPONINI <0.30   Radiological Exams on Admission: Mr Abdomen W Wo Contrast  04/03/2013   *RADIOLOGY REPORT*  Clinical Data: 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 Enlarged left adrenal gland.  Assess for possible adrenal mass  MRI ABDOMEN WITH AND WITHOUT CONTRAST   Technique:  Multiplanar multisequence MR imaging of the abdomen was performed both before and after administration of intravenous contrast.  Contrast: 20mL MULTIHANCE GADOBENATE DIMEGLUMINE 529 MG/ML IV SOLN  Comparison: CT scan 04/03/2013.  Findings: As demonstrated on the CT scan there is extensive occlusive clot in the IVC.  This begins at the L3 level and extends into the intrahepatic IVC.  It ends approximately 3 cm below the right hemi diaphragm.  There is also extensive obstructing thrombus in the left renal vein.  No worrisome left renal mass to suggest renal cell carcinoma.  There are a few  simple appearing left renal cyst.  The left adrenal gland is markedly enlarged and appears edematous but maintains it's adreniform shape.  No worrisome enhancement, discrete mass or signal loss on the out of phase images.  I suspect the enlargement is due to venous obstruction and engorgement.  The liver is unremarkable.  No focal hepatic lesions or intrahepatic biliary dilatation.  The hepatic veins are patent. The portal vein is patent.  The gallbladder is normal.  No common bile duct dilatation.  The pancreas is normal.  The spleen is normal.  Advanced atherosclerotic calcification involving the aorta. The major branch vessels are patent.  No mesenteric or retroperitoneal mass or adenopathy.  Fairly extensive left perirenal collateral vessels are noted.  IMPRESSION:  1.  IVC and left renal vein thrombosis as discussed above.  This is most likely a primary process of IVC thrombosis subsequently occluding the left renal vein and left adrenal vein.  The left adrenal gland is enlarged most likely due to venous obstruction and engorgement.  No definite mass.  Recommend treatment for the IVC thrombosis and reassessment of the adrenal gland after the thrombosis has resolved. 2.  Renal cysts but no renal mass lesions. 3.  Patent right renal vein.   Original Report Authenticated By: Rudie Meyer, M.D.   Ct Abdomen Pelvis W  Contrast  04/03/2013   *RADIOLOGY REPORT*  Clinical Data: Abdominal pain.  CT ABDOMEN AND PELVIS WITH CONTRAST  Technique:  Multidetector CT imaging of the abdomen and pelvis was performed following the standard protocol during bolus administration of intravenous contrast.  Contrast: 50mL OMNIPAQUE IOHEXOL 300 MG/ML  SOLN, OMNIPAQUE IOHEXOL 300 MG/ML  SOLN  Comparison: 04/29/2007.  Findings:  BODY WALL: Unremarkable.  LOWER CHEST:  Mediastinum: Unremarkable.  Lungs/pleura: No consolidation.  ABDOMEN/PELVIS:  Liver: No focal abnormality.  Biliary: No evidence of biliary obstruction or stone.  Pancreas: Unremarkable.  Spleen: Unremarkable.  Adrenals: Diffuse enlargement indistinct appearance of the adrenal gland, measuring at least 5 cm in length by 2.6 cm in thickness. The abnormal adrenal gland is in continuity with left renal vein thrombosis, and may be the source.  The thrombus extends across the midline into the IVC, up to the hepatic, infrahepatic junction. On delayed imaging, the thrombus appears low density, arguing against thrombus enhancement.  Kidneys and ureters: Despite the venous thrombosis, there is nearly symmetric renal enhancement, with slight retention the left.  2 left renal cysts of both around 3.2 cm in maximal dimension.  No hydronephrosis.  Hilar calcifications are likely arterial atherosclerotic.  Bladder: Unremarkable.  Bowel: No obstruction. Normal appendix.  Retroperitoneum: Retroperitoneal stranding, reactive to the thrombus.  Peritoneum: No free fluid or gas.  Reproductive: Serpiginous high density in the left spermatic cord, likely retained venous contrast in the setting of downstream thrombosis.  Vascular: Left renal vein thrombosis as above, with extension into the IVC.  The caval thrombus extends from the level of the renal hila to the junction of the hepatic and infrahepatic cava.  The thrombus occupies over 50% of the luminal diameter.  Extensive aortic and branch vessel  atherosclerotic  OSSEOUS: No acute abnormalities. Transitional lumbosacral anatomy. Status post interbody fusion at L4-5 and L5-L1, with laminectomies. L4-5 posterior rod and screw fixation.  Remote appearing L3 superior end plate fracture.  Critical Value/emergent results were called by telephone at the time of interpretation on 04/03/2013 at 07:10 a.m. to Dr Preston Fleeting, who verbally acknowledged these results.  IMPRESSION: Left renal vein thrombosis with extensive, nearly occlusive IVC  thrombus.  Suspect causative mass in the enlarged left adrenal gland (versus edematous change from venous outflow obstruction). Recommend multiphase abdominal MRI.   Original Report Authenticated By: Tiburcio Pea    EKG: Independently reviewed.  Assessment/Plan Active Problems:   Peripheral vascular disease, unspecified   Hypertension   Renal vein thrombosis   Tobacco abuse   AKI (acute kidney injury)   Adrenal mass, left   Anemia   Renal vein thrombosis/left adrenal mass - CT scan shows diffuse enlargement of the left adrenal gland, for which a multiphase abdominal MRI is recommended.  - nephrologist - vascular surgeon on call and the mainstay of treatment right now is anticoagulation -heparin gtt - Dr. Fredia Sorrow (IR) and we decided to do workup for pheochromocytoma, and after labs return decision will be made on to how best to approach this inflammation/mass in the left adrenal gland.    AKI - creatinine 1.57, from previous normal renal function in 2009, suspect related to #1. Patient has good urine output. Continue IV fluids.  Anemia - appears chronic. Reticulocytes, iron studies pending.   Peripheral vascular disease - stable, followed as an outpatient by vascular surgery  Tobacco abuse -counseled at length patient and wife about the need to stop smoking cigarettes as well as marijuana considering the peripheral vascular issues patient suffering   DVT prophylaxis - continue heparin drip for  anticoagulation  Code Status: full  Family Communication: wife bedside  Disposition Plan: inpatient  Time spent: 35 min  Marlin Canary DO [facility] Pager [phone]  If 7PM-7AM, please contact night-coverage [email or web address] Password [email or web address] 04/06/2013, 12:06 PM

## 2013-04-07 DIAGNOSIS — I10 Essential (primary) hypertension: Secondary | ICD-10-CM

## 2013-04-07 LAB — BASIC METABOLIC PANEL
BUN: 10 mg/dL (ref 6–23)
CO2: 26 mEq/L (ref 19–32)
Calcium: 10.1 mg/dL (ref 8.4–10.5)
Chloride: 103 mEq/L (ref 96–112)
Creatinine, Ser: 1.01 mg/dL (ref 0.50–1.35)

## 2013-04-07 LAB — CBC
HCT: 26.8 % — ABNORMAL LOW (ref 39.0–52.0)
MCH: 30.4 pg (ref 26.0–34.0)
MCV: 92.7 fL (ref 78.0–100.0)
RBC: 2.89 MIL/uL — ABNORMAL LOW (ref 4.22–5.81)
WBC: 6.7 10*3/uL (ref 4.0–10.5)

## 2013-04-07 MED ORDER — HYDROCODONE-ACETAMINOPHEN 5-325 MG PO TABS
1.0000 | ORAL_TABLET | Freq: Four times a day (QID) | ORAL | Status: DC | PRN
  Administered 2013-04-07 – 2013-04-11 (×7): 2 via ORAL
  Filled 2013-04-07: qty 2
  Filled 2013-04-07: qty 1
  Filled 2013-04-07 (×5): qty 2
  Filled 2013-04-07: qty 1

## 2013-04-07 MED ORDER — LATANOPROST 0.005 % OP SOLN
1.0000 [drp] | Freq: Every day | OPHTHALMIC | Status: DC
  Administered 2013-04-07 – 2013-04-10 (×4): 1 [drp] via OPHTHALMIC
  Filled 2013-04-07: qty 2.5

## 2013-04-07 MED ORDER — METOCLOPRAMIDE HCL 10 MG PO TABS
10.0000 mg | ORAL_TABLET | Freq: Three times a day (TID) | ORAL | Status: DC
  Administered 2013-04-07 – 2013-04-11 (×16): 10 mg via ORAL
  Filled 2013-04-07 (×21): qty 1

## 2013-04-07 MED ORDER — BRIMONIDINE TARTRATE 0.2 % OP SOLN
1.0000 [drp] | Freq: Three times a day (TID) | OPHTHALMIC | Status: DC
  Administered 2013-04-07 – 2013-04-11 (×13): 1 [drp] via OPHTHALMIC
  Filled 2013-04-07: qty 5

## 2013-04-07 NOTE — Progress Notes (Signed)
[facility] Progress note  [name] ZOX:[id number] DOB: 1951/08/21 DOA: 04/03/2013  Referring physician: Dr. Preston Fleeting PCP: Geraldo Pitter, MD  Specialists: none  Chief Complaint: abdominal pain  HPI: [name] is a 62 y.o. male has a past medical history significant for peripheral vascular disease s/p superficial femoral artery to anterior tibial artery bypass with Dr. Madilyn Fireman in 2009, hypertension, tobacco abuse, presents to the emergency room with a chief complaint of left-sided abdominal pain. He's had some nonspecific abdominal pain on and off for the past couple months, and patient thinks that all of this started when he got a tooth extraction followed by several courses of antibiotics for infection. He feels like his abdominal pain may have gotten a little bit worse in the past couple weeks, however last night he feels like he had a different type of abdominal pain into the left lower quadrant and going into his left flank. It is pleuritic in nature and rates it as severe (9/10). He denies any nausea, vomiting or diarrhea. he also describes on and off right-sided chest pain. He denies any shortness of breath. He denies any lightheadedness or dizziness. In the emergency room, patient underwent a CT of the abdomen and pelvis which showed left renal vein thrombosis. Seen by vascular  & vein specialists of [facility] 10/04/2012 per their note Dr. Edilia Bo and Dr. Madilyn Fireman in 2009 performed a left superficial femoral artery to anterior tibial artery bypass   Wanting a regular diet Pain better  Review of Systems: As per history of present illness, otherwise negative  Past Medical History  Diagnosis Date  . Diabetes mellitus without complication   . Hypertension   . Arthritis 04/07/08    Degenerative Disc Disease  . Irritable bowel syndrome 04/07/08  . Glaucoma   . Peripheral vascular disease    Past Surgical History  Procedure Laterality Date  . Colonoscopy  01/11/11  . Femoral-tibial  bypass graft  Aug. 7, 2009    Left  SFA to AFA  . Back surgery  2005   Social History:  reports that he has been smoking Cigarettes.  He has been smoking about 0.50 packs per day. He has never used smokeless tobacco. He reports that he uses illicit drugs (Hydrocodone and Other-see comments) about once per week. His alcohol history is not on file.  No Known Allergies  Family History  Problem Relation Age of Onset  . Congestive Heart Failure Mother   . Diabetes Mother   . Heart disease Mother     Coronary Disease  . Heart disease Father     Coronary Disease with Bypass Surgery  . Diabetes Paternal Aunt   . Stroke Paternal Aunt   . Diabetes Paternal Uncle   . Stroke Paternal Uncle     Prior to Admission medications   Medication Sig Start Date End Date Taking? Authorizing Provider  acidophilus (RISAQUAD) CAPS Take 1 capsule by mouth daily.   Yes Historical Provider, MD  amLODipine (NORVASC) 10 MG tablet Take 10 mg by mouth daily.   Yes Historical Provider, MD  brimonidine (ALPHAGAN) 0.2 % ophthalmic solution Place 1 drop into both eyes 3 (three) times daily.   Yes Historical Provider, MD  latanoprost (XALATAN) 0.005 % ophthalmic solution Place into both eyes At bedtime. 08/23/12  Yes Historical Provider, MD  losartan-hydrochlorothiazide (HYZAAR) 100-25 MG per tablet Take 1 tablet by mouth daily.   Yes Historical Provider, MD  metoCLOPramide (REGLAN) 10 MG tablet Take 10 mg by mouth 4 (four)  times daily.   Yes Historical Provider, MD  Aliskiren-Hydrochlorothiazide (TEKTURNA HCT PO) Take 300 mg by mouth daily.    Historical Provider, MD  amLODipine-olmesartan (AZOR) 10-40 MG per tablet Take 1 tablet by mouth daily.    Historical Provider, MD  aspirin 81 MG tablet Take 81 mg by mouth daily.    Historical Provider, MD  buPROPion (WELLBUTRIN SR) 150 MG 12 hr tablet Take 150 mg by mouth 2 (two) times daily.    Historical Provider, MD  clopidogrel (PLAVIX) 75 MG tablet Take 75 mg by mouth  daily.    Historical Provider, MD  nebivolol (BYSTOLIC) 10 MG tablet Take 10 mg by mouth daily.    Historical Provider, MD  sildenafil (VIAGRA) 25 MG tablet Take 25 mg by mouth daily as needed.    Historical Provider, MD   Physical Exam: Filed Vitals:   04/06/13 1318 04/06/13 2155 04/07/13 0626 04/07/13 1022  BP: 139/72 150/73 96/67 121/71  Pulse: 79 112 88 98  Temp: 98.4 F (36.9 C) 99.2 F (37.3 C) 98.8 F (37.1 C)   TempSrc: Oral Oral Oral   Resp: 19 20 20 16   Height:      Weight:   85.8 kg (189 lb 2.5 oz)   SpO2: 97% 96% 99% 99%     General: Alert,NAD  Cardiovascular: Regular rhythm and rate, negative murmurs rubs or gallops, DP/PT pulse 2+ bilateral   Respiratory: CTA biL, good air movement without wheezing, rhonchi or crackled  Abdomen: soft, tender to palpation in the left upper quadrant. No rebound tenderness.   Skin: no rashes  Musculoskeletal: no peripheral edema; left CVA tenderness   Labs on Admission:  Basic Metabolic Panel:   Recent Labs Lab 04/03/13 0500 04/04/13 1733 04/07/13 0600  NA 132*  --  140  K 3.9  --  3.9  CL 96  --  103  CO2 27  --  26  GLUCOSE 121*  --  98  BUN 23  --  10  CREATININE 1.57* 1.09 1.01  CALCIUM 10.3  --  10.1   Liver Function Tests:  Recent Labs Lab 04/03/13 0500  AST 17  ALT 16  ALKPHOS 118*  BILITOT 0.8  PROT 7.9  ALBUMIN 4.2    Recent Labs Lab 04/03/13 0500  LIPASE 31   CBC:  Recent Labs Lab 04/03/13 0500 04/04/13 0415 04/05/13 0500 04/06/13 0410 04/07/13 0600  WBC 7.1 6.8 6.4 6.6 6.7  NEUTROABS 4.4  --   --   --   --   HGB 8.5* 8.6* 8.7* 8.8* 8.8*  HCT 25.4* 25.9* 26.2* 26.4* 26.8*  MCV 93.4 92.5 92.3 92.3 92.7  PLT 187 203 216 227 208   Cardiac Enzymes:  Recent Labs Lab 04/03/13 0500  TROPONINI <0.30   Radiological Exams on Admission: Mr Abdomen W Wo Contrast  04/03/2013   *RADIOLOGY REPORT*  Clinical Data: 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 Enlarged left adrenal  gland.  Assess for possible adrenal mass  MRI ABDOMEN WITH AND WITHOUT CONTRAST  Technique:  Multiplanar multisequence MR imaging of the abdomen was performed both before and after administration of intravenous contrast.  Contrast: 20mL MULTIHANCE GADOBENATE DIMEGLUMINE 529 MG/ML IV SOLN  Comparison: CT scan 04/03/2013.  Findings: As demonstrated on the CT scan there is extensive occlusive clot in the IVC.  This begins at the L3 level and extends into the intrahepatic IVC.  It ends approximately 3 cm below the right hemi diaphragm.  There is also extensive  obstructing thrombus in the left renal vein.  No worrisome left renal mass to suggest renal cell carcinoma.  There are a few simple appearing left renal cyst.  The left adrenal gland is markedly enlarged and appears edematous but maintains it's adreniform shape.  No worrisome enhancement, discrete mass or signal loss on the out of phase images.  I suspect the enlargement is due to venous obstruction and engorgement.  The liver is unremarkable.  No focal hepatic lesions or intrahepatic biliary dilatation.  The hepatic veins are patent. The portal vein is patent.  The gallbladder is normal.  No common bile duct dilatation.  The pancreas is normal.  The spleen is normal.  Advanced atherosclerotic calcification involving the aorta. The major branch vessels are patent.  No mesenteric or retroperitoneal mass or adenopathy.  Fairly extensive left perirenal collateral vessels are noted.  IMPRESSION:  1.  IVC and left renal vein thrombosis as discussed above.  This is most likely a primary process of IVC thrombosis subsequently occluding the left renal vein and left adrenal vein.  The left adrenal gland is enlarged most likely due to venous obstruction and engorgement.  No definite mass.  Recommend treatment for the IVC thrombosis and reassessment of the adrenal gland after the thrombosis has resolved. 2.  Renal cysts but no renal mass lesions. 3.  Patent right renal vein.    Original Report Authenticated By: Rudie Meyer, M.D.   Ct Abdomen Pelvis W Contrast  04/03/2013   *RADIOLOGY REPORT*  Clinical Data: Abdominal pain.  CT ABDOMEN AND PELVIS WITH CONTRAST  Technique:  Multidetector CT imaging of the abdomen and pelvis was performed following the standard protocol during bolus administration of intravenous contrast.  Contrast: 50mL OMNIPAQUE IOHEXOL 300 MG/ML  SOLN, OMNIPAQUE IOHEXOL 300 MG/ML  SOLN  Comparison: 04/29/2007.  Findings:  BODY WALL: Unremarkable.  LOWER CHEST:  Mediastinum: Unremarkable.  Lungs/pleura: No consolidation.  ABDOMEN/PELVIS:  Liver: No focal abnormality.  Biliary: No evidence of biliary obstruction or stone.  Pancreas: Unremarkable.  Spleen: Unremarkable.  Adrenals: Diffuse enlargement indistinct appearance of the adrenal gland, measuring at least 5 cm in length by 2.6 cm in thickness. The abnormal adrenal gland is in continuity with left renal vein thrombosis, and may be the source.  The thrombus extends across the midline into the IVC, up to the hepatic, infrahepatic junction. On delayed imaging, the thrombus appears low density, arguing against thrombus enhancement.  Kidneys and ureters: Despite the venous thrombosis, there is nearly symmetric renal enhancement, with slight retention the left.  2 left renal cysts of both around 3.2 cm in maximal dimension.  No hydronephrosis.  Hilar calcifications are likely arterial atherosclerotic.  Bladder: Unremarkable.  Bowel: No obstruction. Normal appendix.  Retroperitoneum: Retroperitoneal stranding, reactive to the thrombus.  Peritoneum: No free fluid or gas.  Reproductive: Serpiginous high density in the left spermatic cord, likely retained venous contrast in the setting of downstream thrombosis.  Vascular: Left renal vein thrombosis as above, with extension into the IVC.  The caval thrombus extends from the level of the renal hila to the junction of the hepatic and infrahepatic cava.  The thrombus  occupies over 50% of the luminal diameter.  Extensive aortic and branch vessel atherosclerotic  OSSEOUS: No acute abnormalities. Transitional lumbosacral anatomy. Status post interbody fusion at L4-5 and L5-L1, with laminectomies. L4-5 posterior rod and screw fixation.  Remote appearing L3 superior end plate fracture.  Critical Value/emergent results were called by telephone at the time of interpretation on  04/03/2013 at 07:10 a.m. to Dr Preston Fleeting, who verbally acknowledged these results.  IMPRESSION: Left renal vein thrombosis with extensive, nearly occlusive IVC thrombus.  Suspect causative mass in the enlarged left adrenal gland (versus edematous change from venous outflow obstruction). Recommend multiphase abdominal MRI.   Original Report Authenticated By: Tiburcio Pea    EKG: Independently reviewed.  Assessment/Plan Active Problems:   Peripheral vascular disease, unspecified   Hypertension   Renal vein thrombosis   Tobacco abuse   AKI (acute kidney injury)   Adrenal mass, left   Anemia   Renal vein thrombosis/left adrenal mass - CT scan shows diffuse enlargement of the left adrenal gland, for which a multiphase abdominal MRI is recommended.  - nephrologist - vascular surgeon on call and the mainstay of treatment right now is anticoagulation -heparin gtt - Dr. Joseph Art spoke with Dr. Fredia Sorrow (IR) and  decided to do workup for pheochromocytoma, and after labs return decision will be made on to how best to approach this inflammation/venous back up/mass in the left adrenal gland.  -left message with IR to comfirm plan   AKI - creatinine 1.57, from previous normal renal function in 2009, suspect related to #1. Patient has good urine output. Continue IV fluids.  Anemia - appears chronic.    Peripheral vascular disease - stable, followed as an outpatient by vascular surgery  Tobacco abuse -counseled at length patient and wife about the need to stop smoking cigarettes as well as marijuana  considering the peripheral vascular issues patient suffering   DVT prophylaxis - continue heparin drip for anticoagulation  Code Status: full  Family Communication: patient  Disposition Plan: inpatient  Time spent: 35 min  Marlin Canary DO [facility] Pager [phone]  If 7PM-7AM, please contact night-coverage [email or web address] Password [facility] 04/07/2013, 10:31 AM

## 2013-04-07 NOTE — Progress Notes (Signed)
ANTICOAGULATION CONSULT NOTE - Follow Up Consult  Pharmacy Consult for Heparin Indication: IVC and renal vein thrombosis  No Known Allergies  Patient Measurements: Height: 6\' 2"  (188 cm) Weight: 189 lb 2.5 oz (85.8 kg) IBW/kg (Calculated) : 82.2 Heparin Dosing Weight: 85 kg  Vital Signs: Temp: 98.8 F (37.1 C) (08/07 0626) Temp src: Oral (08/07 0626) BP: 96/67 mmHg (08/07 0626) Pulse Rate: 88 (08/07 0626)  Labs:  Recent Labs  04/04/13 1023 04/04/13 1733  04/05/13 0500 04/06/13 0410 04/07/13 0600  HGB  --   --   < > 8.7* 8.8* 8.8*  HCT  --   --   --  26.2* 26.4* 26.8*  PLT  --   --   --  216 227 208  HEPARINUNFRC 0.29*  --   < > 0.53 0.55 0.56  CREATININE  --  1.09  --   --   --  1.01  < > = values in this interval not displayed.  Estimated Creatinine Clearance: 89.3 ml/min (by C-G formula based on Cr of 1.01).   Medications:  Infusions:  . sodium chloride 75 mL/hr at 04/04/13 1006  . heparin 1,400 Units/hr (04/07/13 0253)    Assessment: 62 y/o male on a heparin drip for IVC and renal vein thrombosis. Heparin level is therapeutic at 0.56 on 1400 units/hr. No bleeding noted, Hb is low but stable, platelets are wnl.  Goal of Therapy:  Heparin level 0.3-0.7 units/ml Monitor platelets by anticoagulation protocol: Yes   Plan:  -Continue heparin drip at 1400 units/hr -Daily heparin level and CBC -Monitor for signs/symptoms of bleeding -MD, what are long-term plans for anticoagulation?  [place], [address].D., BCPS Clinical Pharmacist Pager: [phone] 04/07/2013 8:27 AM

## 2013-04-08 LAB — CBC
HCT: 24.8 % — ABNORMAL LOW (ref 39.0–52.0)
Hemoglobin: 8.3 g/dL — ABNORMAL LOW (ref 13.0–17.0)
MCH: 31 pg (ref 26.0–34.0)
MCHC: 33.5 g/dL (ref 30.0–36.0)
MCV: 92.5 fL (ref 78.0–100.0)
RDW: 13.9 % (ref 11.5–15.5)

## 2013-04-08 MED ORDER — HYDROMORPHONE HCL PF 1 MG/ML IJ SOLN
0.5000 mg | INTRAMUSCULAR | Status: DC | PRN
  Administered 2013-04-08 – 2013-04-11 (×9): 0.5 mg via INTRAVENOUS
  Filled 2013-04-08 (×9): qty 1

## 2013-04-08 NOTE — Progress Notes (Signed)
Utilization Review Completed.   Sebastien Jackson, RN, BSN Nurse Case Manager  [phone]  

## 2013-04-08 NOTE — Progress Notes (Signed)
ANTICOAGULATION CONSULT NOTE - Follow Up Consult  Pharmacy Consult for Heparin Indication: IVC and renal vein thrombosis  No Known Allergies  Patient Measurements: Height: 6\' 2"  (188 cm) Weight: 190 lb 4.8 oz (86.32 kg) (scale A) IBW/kg (Calculated) : 82.2 Heparin Dosing Weight: 85 kg  Vital Signs: Temp: 98.3 F (36.8 C) (08/08 0519) Temp src: Oral (08/08 0519) BP: 147/74 mmHg (08/08 0519) Pulse Rate: 90 (08/08 0519)  Labs:  Recent Labs  04/06/13 0410 04/07/13 0600 04/08/13 0417  HGB 8.8* 8.8* 8.3*  HCT 26.4* 26.8* 24.8*  PLT 227 208 202  HEPARINUNFRC 0.55 0.56 0.53  CREATININE  --  1.01  --     Estimated Creatinine Clearance: 89.3 ml/min (by C-G formula based on Cr of 1.01).   Medications:  Infusions:  . sodium chloride 75 mL/hr at 04/04/13 1006  . heparin 1,400 Units/hr (04/07/13 1924)    Assessment: 62 y/o male on a heparin drip for IVC and renal vein thrombosis. Heparin level is therapeutic at 0.53 on 1400 units/hr. No bleeding noted, Hb is low but stable, platelets are wnl. Awaiting for pheochromocytoma results before deciding on long term anticoagulation plans.  Goal of Therapy:  Heparin level 0.3-0.7 units/ml Monitor platelets by anticoagulation protocol: Yes   Plan:  -Continue heparin drip at 1400 units/hr -Daily heparin level and CBC -Monitor for signs/symptoms of bleeding  [facility], [place].D., BCPS Clinical Pharmacist Pager: [phone] 04/08/2013 8:00 AM

## 2013-04-08 NOTE — Progress Notes (Signed)
Pt. Alert and oriented this am. No s/s of distress noted. Pt. Continues to c/o left sided lower abdominal pain. PRN pain medication administered during the night. Pt. Resting in bed. Call light within reach. RN will continue to monitor for changes in condition.Blood pressure 147/74, pulse 90, temperature 98.3 F (36.8 C), temperature source Oral, resp. rate 18, height 6\' 2"  (1.88 m), weight 86.32 kg (190 lb 4.8 oz), SpO2 100.00%.  Kitana Gage, Cheryll Dessert

## 2013-04-08 NOTE — Progress Notes (Signed)
[facility] PROGRESS NOTE  [name]:[id number] DOB: 06/20/1951 DOA: 04/03/2013 PCP: Geraldo Pitter, MD  Assessment/Plan: Active Problems:   Peripheral vascular disease, unspecified   Hypertension   Renal vein thrombosis   Tobacco abuse   AKI (acute kidney injury)   Adrenal mass, left   Anemia   Renal vein thrombosis/left adrenal mass - CT scan shows diffuse enlargement of the left adrenal gland, for which a multiphase abdominal MRI is recommended.  - nephrologist - vascular surgeon on call and the mainstay of treatment right now is anticoagulation -heparin gtt - Dr. Joseph Art spoke with Dr. Fredia Sorrow (IR) and  decided to do workup for pheochromocytoma, and after labs return decision will be made on to how best to approach this inflammation/venous back up/mass in the left adrenal gland.  -spoke with IR who is following in background- appreciate their help -will need repeat CT scan in 2-3 months to assess clots   AKI - creatinine 1.57, from previous normal renal function in 2009, suspect related to #1. Patient has good urine output. Continue IV fluids.  Anemia - appears chronic.    Peripheral vascular disease - stable, followed as an outpatient by vascular surgery  Tobacco abuse -counseled at length patient and wife about the need to stop smoking cigarettes as well as marijuana considering the peripheral vascular issues patient suffering   DVT prophylaxis - continue heparin drip for anticoagulation until biopsy started  Code Status: full  Family Communication: patient  Disposition Plan: inpatient    Consultants:  IR  Procedures:    Antibiotics:    HPI/Subjective: C/o pain in side but does not appear in distress and easily falls asleep +BM  Objective: Filed Vitals:   04/08/13 1005  BP: 133/65  Pulse: 95  Temp:   Resp: 18    Intake/Output Summary (Last 24 hours) at 04/08/13 1100 Last data filed at 04/08/13 1000  Gross per 24 hour  Intake    1698 ml  Output   2125 ml  Net   -427 ml   Filed Weights   04/06/13 0457 04/07/13 0626 04/08/13 0519  Weight: 85.14 kg (187 lb 11.2 oz) 85.8 kg (189 lb 2.5 oz) 86.32 kg (190 lb 4.8 oz)    Exam:  General: Alert,NAD  Cardiovascular: Regular rhythm and rate, negative murmurs rubs or gallops, DP/PT pulse 2+ bilateral  Respiratory: CTA biL, good air movement without wheezing, rhonchi or crackled Abdomen: soft, tender to palpation in the left upper quadrant. No rebound tenderness.  Skin: no rashes  Musculoskeletal: no peripheral edema; left CVA tenderness   Data Reviewed: Basic Metabolic Panel:  Recent Labs Lab 04/03/13 0500 04/04/13 1733 04/07/13 0600  NA 132*  --  140  K 3.9  --  3.9  CL 96  --  103  CO2 27  --  26  GLUCOSE 121*  --  98  BUN 23  --  10  CREATININE 1.57* 1.09 1.01  CALCIUM 10.3  --  10.1   Liver Function Tests:  Recent Labs Lab 04/03/13 0500  AST 17  ALT 16  ALKPHOS 118*  BILITOT 0.8  PROT 7.9  ALBUMIN 4.2    Recent Labs Lab 04/03/13 0500  LIPASE 31   No results found for this basename: AMMONIA,  in the last 168 hours CBC:  Recent Labs Lab 04/03/13 0500 04/04/13 0415 04/05/13 0500 04/06/13 0410 04/07/13 0600 04/08/13 0417  WBC 7.1 6.8 6.4 6.6 6.7 6.3  NEUTROABS 4.4  --   --   --   --   --  HGB 8.5* 8.6* 8.7* 8.8* 8.8* 8.3*  HCT 25.4* 25.9* 26.2* 26.4* 26.8* 24.8*  MCV 93.4 92.5 92.3 92.3 92.7 92.5  PLT 187 203 216 227 208 202   Cardiac Enzymes:  Recent Labs Lab 04/03/13 0500  TROPONINI <0.30   BNP (last 3 results) No results found for this basename: PROBNP,  in the last 8760 hours CBG:  Recent Labs Lab 04/03/13 0923  GLUCAP 159*    No results found for this or any previous visit (from the past 240 hour(s)).   Studies: No results found.  Scheduled Meds: . brimonidine  1 drop Both Eyes TID  . latanoprost  1 drop Both Eyes QHS  . metoCLOPramide  10 mg Oral TID AC & HS  . sodium chloride  3 mL Intravenous Q12H    Continuous Infusions: . sodium chloride 75 mL/hr at 04/04/13 1006  . heparin 1,400 Units/hr (04/07/13 1924)    Active Problems:   Peripheral vascular disease, unspecified   Hypertension   Renal vein thrombosis   Tobacco abuse   AKI (acute kidney injury)   Adrenal mass, left   Anemia    Time spent: 35    [facility], [place] Pracht  [facility] Pager [phone]. If 7PM-7AM, please contact night-coverage at [email or web address], password [facility] 04/08/2013, 11:00 AM  LOS: 5 days

## 2013-04-09 LAB — CBC
HCT: 27 % — ABNORMAL LOW (ref 39.0–52.0)
MCH: 30.2 pg (ref 26.0–34.0)
MCHC: 32.6 g/dL (ref 30.0–36.0)
MCV: 92.8 fL (ref 78.0–100.0)
RDW: 14 % (ref 11.5–15.5)

## 2013-04-09 NOTE — Progress Notes (Signed)
Pt. Alert and oriented this am. No s/s of distress noted. Pt. Able to rest some during the night however continues to have L side abdominal pain. Pt. In bed resting quietly. Call light within reach. Blood pressure 133/70, pulse 89, temperature 98.5 F (36.9 C), temperature source Oral, resp. rate 20, height 6\' 2"  (1.88 m), weight 86 kg (189 lb 9.5 oz), SpO2 100.00%. Cecylia Brazill, Cheryll Dessert

## 2013-04-09 NOTE — Progress Notes (Addendum)
ANTICOAGULATION CONSULT NOTE - Follow Up Consult  Pharmacy Consult for Heparin Indication: IVC and renal vein thrombosis  No Known Allergies  Patient Measurements: Height: 6\' 2"  (188 cm) Weight: 189 lb 9.5 oz (86 kg) IBW/kg (Calculated) : 82.2 Heparin Dosing Weight: 85 kg  Vital Signs: Temp: 98.5 F (36.9 C) (08/09 0340) Temp src: Oral (08/09 0340) BP: 133/70 mmHg (08/09 0340) Pulse Rate: 89 (08/09 0340)  Labs:  Recent Labs  04/07/13 0600 04/08/13 0417 04/09/13 0415  HGB 8.8* 8.3* 8.8*  HCT 26.8* 24.8* 27.0*  PLT 208 202 212  HEPARINUNFRC 0.56 0.53 <0.10*  CREATININE 1.01  --   --     Estimated Creatinine Clearance: 89.3 ml/min (by C-G formula based on Cr of 1.01).   Medications:  Infusions:  . sodium chloride 75 mL/hr at 04/04/13 1006  . heparin 1,400 Units/hr (04/08/13 1433)    Assessment: 62 y/o male on a heparin drip for IVC and renal vein thrombosis. Heparin level is subtherapeutic at <0.10 on 1400 units/hr today, but pt has been therapeutic at this level for about 5 days. Spoke with RN this AM, she was not sure if last night heparin was turned off or not.  Will continue at current dose and recheck in 6 hours.  No bleeding noted, Hb is low but stable, platelets are wnl. Awaiting for pheochromocytoma results before deciding on long term anticoagulation plans.  Goal of Therapy:  Heparin level 0.3-0.7 units/ml Monitor platelets by anticoagulation protocol: Yes   Plan:  -Continue heparin drip at 1400 units/hr -Daily heparin level and CBC -Monitor for signs/symptoms of bleeding  Anabel Bene, PharmD Clinical Pharmacist Pager: [phone]   Addendum: 6 hour heparin level is therapeutic at 0.30. Will continue drip at current rate of 1400 units/hr and follow up heparin level in the morning.  Louie Casa, PharmD, BCPS 04/09/2013, 4:37PM

## 2013-04-09 NOTE — Progress Notes (Signed)
[facility] PROGRESS NOTE  JAEGER TRUEHEART ZOX:[id number] DOB: 13-Apr-1951 DOA: 04/03/2013 PCP: Geraldo Pitter, MD  Assessment/Plan: Active Problems:   Peripheral vascular disease, unspecified   Hypertension   Renal vein thrombosis   Tobacco abuse   AKI (acute kidney injury)   Adrenal mass, left   Anemia   Renal vein thrombosis/left adrenal mass - CT scan shows diffuse enlargement of the left adrenal gland, for which a multiphase abdominal MRI was done - vascular surgeon on call said the mainstay of treatment right now is anticoagulation -heparin gtt - Dr. Joseph Art spoke with Dr. Fredia Sorrow (IR) and  decided to do workup for pheochromocytoma, and after labs return decision will be made on to how best to approach this inflammation/venous back up/mass in the left adrenal gland.  -spoke with IR who is following in background- appreciate their help -called lab x 2 to find out how long for pheo labs to come back- no one able to give time frame as outside lab- no answer at outside lab -will need repeat CT scan in 2-3 months to assess clots   AKI - creatinine 1.57, from previous normal renal function in 2009, suspect related to #1. Patient has good urine output.  Anemia - appears chronic.    Peripheral vascular disease - stable, followed as an outpatient by vascular surgery  Tobacco abuse -counseled at length patient and wife about the need to stop smoking cigarettes as well as marijuana considering the peripheral vascular issues patient suffering   DVT prophylaxis - continue heparin drip for anticoagulation until biopsy   Code Status: full  Family Communication: patient  Disposition Plan: inpatient    Consultants:  IR  Procedures:    Antibiotics:    HPI/Subjective: C/o pain in side but does not appear in distress and easily falls asleep +BM   Objective: Filed Vitals:   04/09/13 0340  BP: 133/70  Pulse: 89  Temp: 98.5 F (36.9 C)  Resp: 20    Intake/Output  Summary (Last 24 hours) at 04/09/13 0856 Last data filed at 04/09/13 0700  Gross per 24 hour  Intake   1292 ml  Output   2700 ml  Net  -1408 ml   Filed Weights   04/07/13 0626 04/08/13 0519 04/09/13 0340  Weight: 85.8 kg (189 lb 2.5 oz) 86.32 kg (190 lb 4.8 oz) 86 kg (189 lb 9.5 oz)    Exam:  General: Alert,NAD  Cardiovascular: Regular rhythm and rate, negative murmurs rubs or gallops, DP/PT pulse 2+ bilateral  Respiratory: CTA biL, good air movement without wheezing, rhonchi or crackled Abdomen: soft, tender to palpation in the left upper quadrant. No rebound tenderness.  Skin: no rashes  Musculoskeletal: no peripheral edema; left CVA tenderness   Data Reviewed: Basic Metabolic Panel:  Recent Labs Lab 04/03/13 0500 04/04/13 1733 04/07/13 0600  NA 132*  --  140  K 3.9  --  3.9  CL 96  --  103  CO2 27  --  26  GLUCOSE 121*  --  98  BUN 23  --  10  CREATININE 1.57* 1.09 1.01  CALCIUM 10.3  --  10.1   Liver Function Tests:  Recent Labs Lab 04/03/13 0500  AST 17  ALT 16  ALKPHOS 118*  BILITOT 0.8  PROT 7.9  ALBUMIN 4.2    Recent Labs Lab 04/03/13 0500  LIPASE 31   No results found for this basename: AMMONIA,  in the last 168 hours CBC:  Recent Labs Lab 04/03/13 0500  04/05/13 0500 04/06/13 0410 04/07/13 0600 04/08/13 0417 04/09/13 0415  WBC 7.1  < > 6.4 6.6 6.7 6.3 6.8  NEUTROABS 4.4  --   --   --   --   --   --   HGB 8.5*  < > 8.7* 8.8* 8.8* 8.3* 8.8*  HCT 25.4*  < > 26.2* 26.4* 26.8* 24.8* 27.0*  MCV 93.4  < > 92.3 92.3 92.7 92.5 92.8  PLT 187  < > 216 227 208 202 212  < > = values in this interval not displayed. Cardiac Enzymes:  Recent Labs Lab 04/03/13 0500  TROPONINI <0.30   BNP (last 3 results) No results found for this basename: PROBNP,  in the last 8760 hours CBG:  Recent Labs Lab 04/03/13 0923  GLUCAP 159*    No results found for this or any previous visit (from the past 240 hour(s)).   Studies: No results  found.  Scheduled Meds: . brimonidine  1 drop Both Eyes TID  . latanoprost  1 drop Both Eyes QHS  . metoCLOPramide  10 mg Oral TID AC & HS  . sodium chloride  3 mL Intravenous Q12H   Continuous Infusions: . sodium chloride 75 mL/hr at 04/04/13 1006  . heparin 1,400 Units/hr (04/08/13 1433)    Active Problems:   Peripheral vascular disease, unspecified   Hypertension   Renal vein thrombosis   Tobacco abuse   AKI (acute kidney injury)   Adrenal mass, left   Anemia    Time spent: 25    [facility], JESSICA  [facility] Pager [phone]. If 7PM-7AM, please contact night-coverage at [email or web address], password [facility] 04/09/2013, 8:56 AM  LOS: 6 days

## 2013-04-10 LAB — BASIC METABOLIC PANEL
BUN: 11 mg/dL (ref 6–23)
Calcium: 10.2 mg/dL (ref 8.4–10.5)
Chloride: 107 mEq/L (ref 96–112)
Creatinine, Ser: 1.04 mg/dL (ref 0.50–1.35)
GFR calc Af Amer: 88 mL/min — ABNORMAL LOW (ref >90)

## 2013-04-10 LAB — CBC
HCT: 24.4 % — ABNORMAL LOW (ref 39.0–52.0)
MCH: 30.8 pg (ref 26.0–34.0)
MCHC: 33.2 g/dL (ref 30.0–36.0)
MCV: 92.8 fL (ref 78.0–100.0)
RDW: 14.2 % (ref 11.5–15.5)
WBC: 6.4 10*3/uL (ref 4.0–10.5)

## 2013-04-10 MED ORDER — METOPROLOL TARTRATE 12.5 MG HALF TABLET
12.5000 mg | ORAL_TABLET | Freq: Two times a day (BID) | ORAL | Status: DC
  Filled 2013-04-10 (×2): qty 1

## 2013-04-10 NOTE — Progress Notes (Signed)
[facility] PROGRESS NOTE  [name] WUJ:[id number] DOB: 09/13/1950 DOA: 04/03/2013 PCP: Geraldo Pitter, MD  Assessment/Plan: Active Problems:   Peripheral vascular disease, unspecified   Hypertension   Renal vein thrombosis   Tobacco abuse   AKI (acute kidney injury)   Adrenal mass, left   Anemia   Renal vein thrombosis/left adrenal mass - CT scan shows diffuse enlargement of the left adrenal gland, for which a multiphase abdominal MRI was done - vascular surgeon on call said the mainstay of treatment right now is anticoagulation -heparin gtt - Dr. Joseph Art spoke with Dr. Fredia Sorrow (IR) and  decided to do workup for pheochromocytoma, and after labs return decision will be made on to how best to approach this inflammation/venous back up/mass in the left adrenal gland.  -spoke with IR who is following in background- appreciate their help -called lab x 2 to find out how long for pheo labs to come back- no one able to give time frame as outside lab- no answer at outside lab- please call in AM- ? If taking a long time to come back- send home and bring back to finish work up -will need repeat CT scan in 2-3 months to assess clots   AKI - creatinine 1.57, from previous normal renal function in 2009, suspect related to #1. Patient has good urine output.  Anemia - appears chronic.    Peripheral vascular disease - stable, followed as an outpatient by vascular surgery  Tobacco abuse -counseled at length patient and wife about the need to stop smoking cigarettes as well as marijuana considering the peripheral vascular issues patient suffering   DVT prophylaxis - continue heparin drip for anticoagulation until biopsy   Code Status: full  Family Communication: patient  Disposition Plan: inpatient    Consultants:  IR  Procedures:    Antibiotics:    HPI/Subjective: C/o pain in side but does not appear in distress and easily falls asleep +BM   Objective: Filed Vitals:    04/10/13 0626  BP: 158/86  Pulse: 96  Temp: 98.7 F (37.1 C)  Resp: 20    Intake/Output Summary (Last 24 hours) at 04/10/13 1310 Last data filed at 04/10/13 1153  Gross per 24 hour  Intake 1540.5 ml  Output   1700 ml  Net -159.5 ml   Filed Weights   04/08/13 0519 04/09/13 0340 04/10/13 0626  Weight: 86.32 kg (190 lb 4.8 oz) 86 kg (189 lb 9.5 oz) 87.2 kg (192 lb 3.9 oz)    Exam:  General: Alert,NAD  Cardiovascular: Regular rhythm and rate, negative murmurs rubs or gallops, DP/PT pulse 2+ bilateral  Respiratory: CTA biL, good air movement without wheezing, rhonchi or crackled Abdomen: soft, tender to palpation in the left upper quadrant. No rebound tenderness.  Skin: no rashes  Musculoskeletal: no peripheral edema; left CVA tenderness   Data Reviewed: Basic Metabolic Panel:  Recent Labs Lab 04/04/13 1733 04/07/13 0600 04/10/13 0500  NA  --  140 141  K  --  3.9 3.6  CL  --  103 107  CO2  --  26 27  GLUCOSE  --  98 103*  BUN  --  10 11  CREATININE 1.09 1.01 1.04  CALCIUM  --  10.1 10.2   Liver Function Tests: No results found for this basename: AST, ALT, ALKPHOS, BILITOT, PROT, ALBUMIN,  in the last 168 hours No results found for this basename: LIPASE, AMYLASE,  in the last 168 hours No results found for this basename:  AMMONIA,  in the last 168 hours CBC:  Recent Labs Lab 04/06/13 0410 04/07/13 0600 04/08/13 0417 04/09/13 0415 04/10/13 0500  WBC 6.6 6.7 6.3 6.8 6.4  HGB 8.8* 8.8* 8.3* 8.8* 8.1*  HCT 26.4* 26.8* 24.8* 27.0* 24.4*  MCV 92.3 92.7 92.5 92.8 92.8  PLT 227 208 202 212 184   Cardiac Enzymes: No results found for this basename: CKTOTAL, CKMB, CKMBINDEX, TROPONINI,  in the last 168 hours BNP (last 3 results) No results found for this basename: PROBNP,  in the last 8760 hours CBG: No results found for this basename: GLUCAP,  in the last 168 hours  No results found for this or any previous visit (from the past 240 hour(s)).   Studies: No  results found.  Scheduled Meds: . brimonidine  1 drop Both Eyes TID  . latanoprost  1 drop Both Eyes QHS  . metoCLOPramide  10 mg Oral TID AC & HS  . metoprolol tartrate  12.5 mg Oral BID  . sodium chloride  3 mL Intravenous Q12H   Continuous Infusions: . heparin 1,400 Units/hr (04/10/13 0120)    Active Problems:   Peripheral vascular disease, unspecified   Hypertension   Renal vein thrombosis   Tobacco abuse   AKI (acute kidney injury)   Adrenal mass, left   Anemia    Time spent: 25    [facility], Nikira Kushnir  [facility] Pager [phone]. If 7PM-7AM, please contact night-coverage at [email or web address], password [facility] 04/10/2013, 1:10 PM  LOS: 7 days

## 2013-04-10 NOTE — Progress Notes (Signed)
ANTICOAGULATION CONSULT NOTE - Follow Up Consult  Pharmacy Consult for Heparin Indication: IVC and renal vein thrombosis  No Known Allergies  Patient Measurements: Height: 6\' 2"  (188 cm) Weight: 192 lb 3.9 oz (87.2 kg) IBW/kg (Calculated) : 82.2 Heparin Dosing Weight: 85 kg  Vital Signs: Temp: 98.7 F (37.1 C) (08/10 0626) Temp src: Oral (08/10 0626) BP: 158/86 mmHg (08/10 0626) Pulse Rate: 96 (08/10 0626)  Labs:  Recent Labs  04/08/13 0417 04/09/13 0415 04/09/13 1536 04/10/13 0500  HGB 8.3* 8.8*  --  8.1*  HCT 24.8* 27.0*  --  24.4*  PLT 202 212  --  184  HEPARINUNFRC 0.53 <0.10* 0.30 0.39  CREATININE  --   --   --  1.04    Estimated Creatinine Clearance: 86.7 ml/min (by C-G formula based on Cr of 1.04).   Medications:  Infusions:  . heparin 1,400 Units/hr (04/10/13 0120)    Assessment: 62 y/o male on a heparin drip for IVC and renal vein thrombosis. Heparin level (0.39) is therapeutic on 1400 units/hr. No bleeding noted, Hb is low (chronic) and slightly down from yesterday, platelets are wnl. No bleeding noted per chart. Awaiting for pheochromocytoma results before deciding on long term anticoagulation plans.  Goal of Therapy:  Heparin level 0.3-0.7 units/ml Monitor platelets by anticoagulation protocol: Yes   Plan:  -Continue heparin drip at 1400 units/hr -Daily heparin level and CBC -Monitor for signs/symptoms of bleeding  Bayard Hugger, PharmD, BCPS  Clinical Pharmacist  Pager: [phone]   04/10/2013 11:09 AM

## 2013-04-10 NOTE — Progress Notes (Signed)
0900 Dr Benjamine Mola aware with sone tachycardia to 140's with and without activity subsides after about 20 mins   spontaneously. No palpitations no cp

## 2013-04-11 ENCOUNTER — Telehealth: Payer: Self-pay | Admitting: Hematology and Oncology

## 2013-04-11 LAB — HEPARIN LEVEL (UNFRACTIONATED): Heparin Unfractionated: 0.38 IU/mL (ref 0.30–0.70)

## 2013-04-11 LAB — CBC
HCT: 26.4 % — ABNORMAL LOW (ref 39.0–52.0)
Hemoglobin: 8.8 g/dL — ABNORMAL LOW (ref 13.0–17.0)
MCHC: 33.3 g/dL (ref 30.0–36.0)

## 2013-04-11 LAB — BASIC METABOLIC PANEL
BUN: 12 mg/dL (ref 6–23)
Chloride: 106 mEq/L (ref 96–112)
GFR calc non Af Amer: 89 mL/min — ABNORMAL LOW (ref >90)
Glucose, Bld: 110 mg/dL — ABNORMAL HIGH (ref 70–99)
Potassium: 3.8 mEq/L (ref 3.5–5.1)

## 2013-04-11 LAB — METANEPHRINES, URINE, 24 HOUR
Metanephrines, Ur: 165 mcg/24 h (ref 90–315)
Volume, Urine-METAN: 2300 mL

## 2013-04-11 MED ORDER — POLYETHYLENE GLYCOL 3350 17 G PO PACK: 17.0000 g | PACK | Freq: Every day | ORAL | Status: AC | PRN

## 2013-04-11 MED ORDER — RIVAROXABAN 15 MG PO TABS
15.0000 mg | ORAL_TABLET | Freq: Two times a day (BID) | ORAL | Status: DC
  Administered 2013-04-11: 15 mg via ORAL
  Filled 2013-04-11 (×4): qty 1

## 2013-04-11 MED ORDER — METOPROLOL TARTRATE 25 MG PO TABS: 25.0000 mg | ORAL_TABLET | Freq: Two times a day (BID) | ORAL | Status: DC

## 2013-04-11 MED ORDER — RIVAROXABAN 20 MG PO TABS: 20.0000 mg | ORAL_TABLET | Freq: Every day | ORAL | Status: DC

## 2013-04-11 MED ORDER — HYDROCODONE-ACETAMINOPHEN 5-325 MG PO TABS: 1.0000 | ORAL_TABLET | Freq: Three times a day (TID) | ORAL | Status: DC | PRN

## 2013-04-11 MED ORDER — RIVAROXABAN 15 MG PO TABS: 15.0000 mg | ORAL_TABLET | Freq: Two times a day (BID) | ORAL | Status: DC

## 2013-04-11 NOTE — Plan of Care (Signed)
Problem: Phase I Progression Outcomes Goal: Initial discharge plan identified Outcome: Completed/Met Date Met:  04/11/13 Pt has Renal Vein thrombosis

## 2013-04-11 NOTE — Progress Notes (Addendum)
ANTICOAGULATION CONSULT NOTE - Follow Up Consult  Pharmacy Consult for Heparin Indication: IVC and renal vein thrombosis  No Known Allergies  Labs:  Recent Labs  04/09/13 0415 04/09/13 1536 04/10/13 0500 04/11/13 0450  HGB 8.8*  --  8.1* 8.8*  HCT 27.0*  --  24.4* 26.4*  PLT 212  --  184 212  HEPARINUNFRC <0.10* 0.30 0.39 0.38  CREATININE  --   --  1.04 0.93    Estimated Creatinine Clearance: 97 ml/min (by C-G formula based on Cr of 0.93).   Medications:  Infusions:  . heparin 1,400 Units/hr (04/10/13 0120)    Assessment: 62 y/o male on a heparin drip for IVC and renal vein thrombosis. Heparin level (0.39) is therapeutic on 1400 units/hr. No bleeding noted, Hb is low (chronic) and slightly down from yesterday, platelets are wnl. No bleeding noted per chart. Awaiting for pheochromocytoma results before deciding on long term anticoagulation plans.  Goal of Therapy:  Heparin level 0.3-0.7 units/ml Monitor platelets by anticoagulation protocol: Yes   Plan:  -Continue heparin drip at 1400 units/hr -Daily heparin level and CBC -Monitor for signs/symptoms of bleeding  Thank you. Okey Regal, PharmD [phone]  04/11/2013 8:28 AM   Changed to Xarelto  Plan 1) Xarelto 15 mg bid x 3 weeks then 20 mg daily 2) DC heparin  Thank you. Okey Regal

## 2013-04-11 NOTE — Progress Notes (Signed)
Went over all discharge instructions with pt and his wife. Went over all home meds, xarelto precautions regard being on a blood thinner and follow up visits. Pharmacist was in earlier and gave pt and his wife a handout about being on Xarelto and answered their questions.Case manager discussed with pt about obtaining xarelto.

## 2013-04-11 NOTE — Care Management Note (Signed)
    Page 1 of 1   04/11/2013     4:06:11 PM   CARE MANAGEMENT NOTE 04/11/2013  Patient:  [name], [name]   Account Number:  [id number]  Date Initiated:  04/11/2013  Documentation initiated by:  Tera Mater  Subjective/Objective Assessment:   62yo male admitted with Abdominal Pain. Pt. lives at home with spouse.     Action/Plan:   discharge planning   Anticipated DC Date:  04/11/2013   Anticipated DC Plan:  HOME/SELF CARE      DC Planning Services  CM consult  Medication Assistance      Choice offered to / List presented to:             Status of service:  Completed, signed off Medicare Important Message given?   (If response is "NO", the following Medicare IM given date fields will be blank) Date Medicare IM given:   Date Additional Medicare IM given:    Discharge Disposition:  HOME/SELF CARE  Per UR Regulation:  Reviewed for med. necessity/level of care/duration of stay  If discussed at Long Length of Stay Meetings, dates discussed:    Comments:  04/11/13 1145 In to speak with pt. about medication assistance.  This NCM gave pt. a 10Day free card for Xarelto, as well as a savings card for the co-pays.  Pt. to dc home today with spouse.  Explained to pt. to call the number on the front of the two cards to enroll into the Xarelto savings plan. Tera Mater, RN, BSN NCM [phone]

## 2013-04-11 NOTE — Telephone Encounter (Signed)
LVOM FOR PT TO RETURN CALL IN RE HOSP F/U APPT

## 2013-04-11 NOTE — Progress Notes (Signed)
Pt discharged per w/c. Accompanied by Nurse tech. Pt has all belongings and prescriptions and discharge instructions . Has xarelto pill for this eve dose since his pharmacy does not carry and must order Xarelto. This script was faxed to [facility] pharmacy as in Pt's admit info. Pt's wife to drive him home per private vehicle.

## 2013-04-11 NOTE — Progress Notes (Signed)
Patient ID: [name], male   DOB: 26-Jun-1951, 62 y.o.   MRN: [id number]   L renal vein; IVC thrombus Adrenal mass Pheochromocytoma evaluation underway  Dr Miles Costain has reviewed labs and imaging: Rec: complete Pheo work up; Anticoagulation; 3 mo repeat imaging to evaluate adrenal mass

## 2013-04-11 NOTE — Discharge Summary (Signed)
[facility]                                                                                   [name], is a 62 y.o. male  DOB 07/09/1951  MRN [id number].  Admission date:  04/03/2013  Discharge Date:  04/11/2013  Primary MD  Geraldo Pitter, MD  Admitting Physician  Pamella Pert, MD  Admission Diagnosis  Thrombosis of renal vein [453.3] Hyponatremia [276.1] Anemia [285.9] Renal insufficiency [593.9] Abdominal pain [789.00]  Discharge Diagnosis     Active Problems:   Peripheral vascular disease, unspecified   Hypertension   Renal vein thrombosis   Tobacco abuse   AKI (acute kidney injury)   Adrenal mass, left   Anemia    Past Medical History  Diagnosis Date  . Diabetes mellitus without complication   . Hypertension   . Arthritis 04/07/08    Degenerative Disc Disease  . Irritable bowel syndrome 04/07/08  . Glaucoma   . Peripheral vascular disease     Past Surgical History  Procedure Laterality Date  . Colonoscopy  01/11/11  . Femoral-tibial bypass graft  Aug. 7, 2009    Left  SFA to AFA  . Back surgery  2005     Recommendations for primary care physician for things to follow:   Please monitor her pheochromocytoma workup results, monitor xaralto dose after 21 days of initial treatment, make sure patient follows with endocrine, oncology and IR as recommended below  Would recommend repeat renal ultrasound along with abdominal MRI in 3-4 weeks' time to be done by primary care physician.    Discharge Diagnoses:   Active Problems:   Peripheral vascular disease, unspecified   Hypertension   Renal vein thrombosis   Tobacco abuse   AKI (acute kidney injury)   Adrenal mass, left   Anemia    Discharge Condition:stable   Diet recommendation: See Discharge Instructions below   Consults Vas.Surg, IR on phone    History of present illness and  Hospital Course:     Kindly see H&P for history of present illness and admission details, please  review complete Labs, Consult reports and Test reports for all details in brief [name], is a 62 y.o. male, patient with history of PAD, hypertension, tobacco abuse counseled to quit smoking, was admitted to the hospital 8 days ago for left-sided abdominal pain which was found to be secondary to left renal vein and IVC thrombosis, he also had some evidence of left adrenal engorgement/mass, he also had mild acute renal insufficiency which is all resolved now, he was kept on heparin drip with good results, his pain is much improved, his renal function is close to his baseline, I have adjusted his blood pressure medications and for now discontinued his diuretic and ACE inhibitor and placed him on a low dose Lopressor along with his home dose Norvasc.   Case was discussed by previous physicians following this patient with vascular surgeon and IR physicians on call multiple times, consensus was to continue anticoagulation which will be the mainstay of treatment at this time, adrenal engorgement was likely due to IVC and renal vein thrombosis, pheochromocytoma workup has been  ordered and it is still pending, plan now is to discharge the patient with outpatient followup with endocrine, PCP, oncology and IR, he likely will require repeat imaging, if adrenal mass/engorgement continues he might require a higher guided biopsy.   Patient will be transitioned to oral xaralto and discharged home on, he will continue on his other home medications unchanged with the exception of discontinuation of his ACE/HCTZ in addition of Lopressor and xaralto. His abdominal pain is much better and he will be placed on low-dose narcotic on an as-needed basis.    Would recommend repeat renal ultrasound along with abdominal MRI in 3-4 weeks' time to be done by primary care physician.   Today   Subjective:   [name] today has no headache,no chest abdominal pain,no new weakness tingling or numbness, feels much better  wants to go home today.   Objective:   Blood pressure 151/95, pulse 101, temperature 98.4 F (36.9 C), temperature source Oral, resp. rate 18, height 6\' 2"  (1.88 m), weight 88.8 kg (195 lb 12.3 oz), SpO2 100.00%.   Intake/Output Summary (Last 24 hours) at 04/11/13 1046 Last data filed at 04/11/13 1029  Gross per 24 hour  Intake   1520 ml  Output   2505 ml  Net   -985 ml    Exam Awake Alert, Oriented *3, No new F.N deficits, Normal affect [place].AT,PERRAL Supple Neck,No JVD, No cervical lymphadenopathy appriciated.  Symmetrical Chest wall movement, Good air movement bilaterally, CTAB RRR,No Gallops,Rubs or new Murmurs, No Parasternal Heave +ve B.Sounds, Abd Soft, Non tender, No organomegaly appriciated, No rebound -guarding or rigidity. No Cyanosis, Clubbing or edema, No new Rash or bruise  Data Review   Major procedures and Radiology Reports - PLEASE review detailed and final reports for all details, in brief -     Mr Abdomen W Wo Contrast  04/03/2013   *RADIOLOGY REPORT*  Clinical Data: 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 Enlarged left adrenal gland.  Assess for possible adrenal mass  MRI ABDOMEN WITH AND WITHOUT CONTRAST  Technique:  Multiplanar multisequence MR imaging of the abdomen was performed both before and after administration of intravenous contrast.  Contrast: 20mL MULTIHANCE GADOBENATE DIMEGLUMINE 529 MG/ML IV SOLN  Comparison: CT scan 04/03/2013.  Findings: As demonstrated on the CT scan there is extensive occlusive clot in the IVC.  This begins at the L3 level and extends into the intrahepatic IVC.  It ends approximately 3 cm below the right hemi diaphragm.  There is also extensive obstructing thrombus in the left renal vein.  No worrisome left renal mass to suggest renal cell carcinoma.  There are a few simple appearing left renal cyst.  The left adrenal gland is markedly enlarged and appears edematous but maintains it's adreniform shape.  No worrisome  enhancement, discrete mass or signal loss on the out of phase images.  I suspect the enlargement is due to venous obstruction and engorgement.  The liver is unremarkable.  No focal hepatic lesions or intrahepatic biliary dilatation.  The hepatic veins are patent. The portal vein is patent.  The gallbladder is normal.  No common bile duct dilatation.  The pancreas is normal.  The spleen is normal.  Advanced atherosclerotic calcification involving the aorta. The major branch vessels are patent.  No mesenteric or retroperitoneal mass or adenopathy.  Fairly extensive left perirenal collateral vessels are noted.  IMPRESSION:  1.  IVC and left renal vein thrombosis as discussed above.  This is most likely a primary process  of IVC thrombosis subsequently occluding the left renal vein and left adrenal vein.  The left adrenal gland is enlarged most likely due to venous obstruction and engorgement.  No definite mass.  Recommend treatment for the IVC thrombosis and reassessment of the adrenal gland after the thrombosis has resolved. 2.  Renal cysts but no renal mass lesions. 3.  Patent right renal vein.   Original Report Authenticated By: Rudie Meyer, M.D.   Ct Abdomen Pelvis W Contrast  04/03/2013   *RADIOLOGY REPORT*  Clinical Data: Abdominal pain.  CT ABDOMEN AND PELVIS WITH CONTRAST  Technique:  Multidetector CT imaging of the abdomen and pelvis was performed following the standard protocol during bolus administration of intravenous contrast.  Contrast: 50mL OMNIPAQUE IOHEXOL 300 MG/ML  SOLN, OMNIPAQUE IOHEXOL 300 MG/ML  SOLN  Comparison: 04/29/2007.  Findings:  BODY WALL: Unremarkable.  LOWER CHEST:  Mediastinum: Unremarkable.  Lungs/pleura: No consolidation.  ABDOMEN/PELVIS:  Liver: No focal abnormality.  Biliary: No evidence of biliary obstruction or stone.  Pancreas: Unremarkable.  Spleen: Unremarkable.  Adrenals: Diffuse enlargement indistinct appearance of the adrenal gland, measuring at least 5 cm in length  by 2.6 cm in thickness. The abnormal adrenal gland is in continuity with left renal vein thrombosis, and may be the source.  The thrombus extends across the midline into the IVC, up to the hepatic, infrahepatic junction. On delayed imaging, the thrombus appears low density, arguing against thrombus enhancement.  Kidneys and ureters: Despite the venous thrombosis, there is nearly symmetric renal enhancement, with slight retention the left.  2 left renal cysts of both around 3.2 cm in maximal dimension.  No hydronephrosis.  Hilar calcifications are likely arterial atherosclerotic.  Bladder: Unremarkable.  Bowel: No obstruction. Normal appendix.  Retroperitoneum: Retroperitoneal stranding, reactive to the thrombus.  Peritoneum: No free fluid or gas.  Reproductive: Serpiginous high density in the left spermatic cord, likely retained venous contrast in the setting of downstream thrombosis.  Vascular: Left renal vein thrombosis as above, with extension into the IVC.  The caval thrombus extends from the level of the renal hila to the junction of the hepatic and infrahepatic cava.  The thrombus occupies over 50% of the luminal diameter.  Extensive aortic and branch vessel atherosclerotic  OSSEOUS: No acute abnormalities. Transitional lumbosacral anatomy. Status post interbody fusion at L4-5 and L5-L1, with laminectomies. L4-5 posterior rod and screw fixation.  Remote appearing L3 superior end plate fracture.  Critical Value/emergent results were called by telephone at the time of interpretation on 04/03/2013 at 07:10 a.m. to Dr Preston Fleeting, who verbally acknowledged these results.  IMPRESSION: Left renal vein thrombosis with extensive, nearly occlusive IVC thrombus.  Suspect causative mass in the enlarged left adrenal gland (versus edematous change from venous outflow obstruction). Recommend multiphase abdominal MRI.   Original Report Authenticated By: Tiburcio Pea    Micro Results     No results found for this or any  previous visit (from the past 240 hour(s)).   CBC w Diff: Lab Results  Component Value Date   WBC 6.9 04/11/2013   HGB 8.8* 04/11/2013   HCT 26.4* 04/11/2013   PLT 212 04/11/2013   LYMPHOPCT 21 04/03/2013   MONOPCT 10 04/03/2013   EOSPCT 6* 04/03/2013   BASOPCT 1 04/03/2013    CMP: Lab Results  Component Value Date   NA 140 04/11/2013   K 3.8 04/11/2013   CL 106 04/11/2013   CO2 24 04/11/2013   BUN 12 04/11/2013   CREATININE 0.93 04/11/2013  CREATININE 1.09 04/04/2013   PROT 7.9 04/03/2013   ALBUMIN 4.2 04/03/2013   BILITOT 0.8 04/03/2013   ALKPHOS 118* 04/03/2013   AST 17 04/03/2013   ALT 16 04/03/2013  .   Discharge Instructions     Follow with Primary MD Geraldo Pitter, MD in 3 days   Get CBC, CMP, checked 3 days by Primary MD and again as instructed by your Primary MD.   Get Medicines reviewed and adjusted.  Please request your Prim.MD to go over all Hospital Tests and Procedure/Radiological results at the follow up, please get all Hospital records sent to your Prim MD by signing hospital release before you go home.  Activity: As tolerated with Full fall precautions use walker/cane & assistance as needed   Diet:  Heart healthy  For Heart failure patients - Check your Weight same time everyday, if you gain over 2 pounds, or you develop in leg swelling, experience more shortness of breath or chest pain, call your Primary MD immediately. Follow Cardiac Low Salt Diet and 1.8 lit/day fluid restriction.  Disposition Home    If you experience worsening of your admission symptoms, develop shortness of breath, life threatening emergency, suicidal or homicidal thoughts you must seek medical attention immediately by calling 911 or calling your MD immediately  if symptoms less severe.  You Must read complete instructions/literature along with all the possible adverse reactions/side effects for all the Medicines you take and that have been prescribed to you. Take any new Medicines after you have  completely understood and accpet all the possible adverse reactions/side effects.   Do not drive and provide baby sitting services if your were admitted for syncope or siezures until you have seen by Primary MD or a Neurologist and advised to do so again.  Do not drive when taking Pain medications.    Do not take more than prescribed Pain, Sleep and Anxiety Medications  Special Instructions: If you have smoked or chewed Tobacco  in the last 2 yrs please stop smoking, stop any regular Alcohol  and or any Recreational drug use.  Wear Seat belts while driving.   Please note  You were cared for by a hospitalist during your hospital stay. If you have any questions about your discharge medications or the care you received while you were in the hospital after you are discharged, you can call the unit and asked to speak with the hospitalist on call if the hospitalist that took care of you is not available. Once you are discharged, your primary care physician will handle any further medical issues. Please note that NO REFILLS for any discharge medications will be authorized once you are discharged, as it is imperative that you return to your primary care physician (or establish a relationship with a primary care physician if you do not have one) for your aftercare needs so that they can reassess your need for medications and monitor your lab values.        Follow-up Information   Follow up with Geraldo Pitter, MD. Schedule an appointment as soon as possible for a visit in 3 days.   Contact information:   [address] [place] [address] [phone]       Follow up with Carlus Pavlov, MD. Schedule an appointment as soon as possible for a visit in 1 week.   Contact information:   [address] [place] [address] [phone]       Follow up with [facility]  M, MD. Schedule an appointment as soon as possible for a visit in 2 weeks.   Contact  information:   [address] [place] [address] [phone]       Follow up with [facility] T, MD. Schedule an appointment as soon as possible for a visit in 2 weeks.   Contact information:   [address] [place] [address] [phone]         Discharge Medications     Medication List    STOP taking these medications       losartan-hydrochlorothiazide 100-25 MG per tablet  Commonly known as:  HYZAAR      TAKE these medications       acidophilus Caps capsule  Take 1 capsule by mouth daily.     amLODipine 10 MG tablet  Commonly known as:  NORVASC  Take 10 mg by mouth daily.     brimonidine 0.2 % ophthalmic solution  Commonly known as:  ALPHAGAN  Place 1 drop into both eyes 3 (three) times daily.     HYDROcodone-acetaminophen 5-325 MG per tablet  Commonly known as:  NORCO/VICODIN  Take 1 tablet by mouth every 8 (eight) hours as needed.     latanoprost 0.005 % ophthalmic solution  Commonly known as:  XALATAN  Place 1 drop into both eyes at bedtime.     metoCLOPramide 10 MG tablet  Commonly known as:  REGLAN  Take 10 mg by mouth 4 (four) times daily.     metoprolol tartrate 25 MG tablet  Commonly known as:  LOPRESSOR  Take 1 tablet (25 mg total) by mouth 2 (two) times daily.     polyethylene glycol packet  Commonly known as:  MIRALAX / GLYCOLAX  Take 17 g by mouth daily as needed.     Rivaroxaban 15 MG Tabs tablet  Commonly known as:  XARELTO  Take 1 tablet (15 mg total) by mouth 2 (two) times daily with a meal. You will need to get dose adjusted after 21 days by her primary care physician           Total Time in preparing paper work, data evaluation and todays exam - 35 minutes  Leroy Sea M.D on 04/11/2013 at 10:46 AM  [facility] Office  [phone]

## 2013-04-19 ENCOUNTER — Other Ambulatory Visit (HOSPITAL_COMMUNITY): Payer: Self-pay | Admitting: Interventional Radiology

## 2013-04-19 DIAGNOSIS — I823 Embolism and thrombosis of renal vein: Secondary | ICD-10-CM

## 2013-04-20 ENCOUNTER — Telehealth: Payer: Self-pay | Admitting: Hematology and Oncology

## 2013-04-20 NOTE — Telephone Encounter (Signed)
S/W PT IN RE TO HOSP F/U APPT 08/21 @ 10:30 W/DR. VICTOR. PT CONFIRMED APPT.

## 2013-04-21 ENCOUNTER — Telehealth: Payer: Self-pay | Admitting: Hematology and Oncology

## 2013-04-21 ENCOUNTER — Ambulatory Visit: Payer: Medicare Other

## 2013-04-21 ENCOUNTER — Ambulatory Visit (HOSPITAL_BASED_OUTPATIENT_CLINIC_OR_DEPARTMENT_OTHER): Payer: Medicare Other | Admitting: Hematology and Oncology

## 2013-04-21 ENCOUNTER — Ambulatory Visit (HOSPITAL_BASED_OUTPATIENT_CLINIC_OR_DEPARTMENT_OTHER): Payer: Medicare Other | Admitting: Lab

## 2013-04-21 ENCOUNTER — Encounter: Payer: Self-pay | Admitting: Hematology and Oncology

## 2013-04-21 VITALS — BP 145/82 | HR 90 | Temp 97.9°F | Resp 18 | Ht 74.0 in | Wt 193.2 lb

## 2013-04-21 DIAGNOSIS — I823 Embolism and thrombosis of renal vein: Secondary | ICD-10-CM

## 2013-04-21 DIAGNOSIS — D649 Anemia, unspecified: Secondary | ICD-10-CM

## 2013-04-21 LAB — CBC & DIFF AND RETIC
Eosinophils Absolute: 0.4 10*3/uL (ref 0.0–0.5)
HGB: 9.7 g/dL — ABNORMAL LOW (ref 13.0–17.1)
Immature Retic Fract: 20.6 % — ABNORMAL HIGH (ref 3.00–10.60)
MONO#: 0.7 10*3/uL (ref 0.1–0.9)
NEUT#: 4.7 10*3/uL (ref 1.5–6.5)
RBC: 3.29 10*6/uL — ABNORMAL LOW (ref 4.20–5.82)
RDW: 14.2 % (ref 11.0–14.6)
Retic %: 3.32 % — ABNORMAL HIGH (ref 0.80–1.80)
Retic Ct Abs: 109.23 10*3/uL — ABNORMAL HIGH (ref 34.80–93.90)
WBC: 7.5 10*3/uL (ref 4.0–10.3)

## 2013-04-21 LAB — VMA AND HVA, 24 HR UR
24 Hour urine volume (VMAHVA): 2300 mL/24 h
Creatinine, Urine mg/day-VMAUR: 2.28 g/(24.h) (ref 0.63–2.50)
Homovanillic Acid, 24H Ur: 5.1 mg/24 h (ref 1.6–7.5)
VMA, 24H Ur Adult: 6.9 mg/(24.h) — ABNORMAL HIGH (ref <=6.0)

## 2013-04-21 LAB — MORPHOLOGY: RBC Comments: NORMAL

## 2013-04-21 NOTE — Progress Notes (Signed)
Checked in new pt with no financial concerns. °

## 2013-04-21 NOTE — Telephone Encounter (Signed)
gave pt appt for lab, Md and chemo August and September

## 2013-04-22 ENCOUNTER — Telehealth: Payer: Self-pay | Admitting: Dietician

## 2013-04-26 ENCOUNTER — Ambulatory Visit
Admission: RE | Admit: 2013-04-26 | Discharge: 2013-04-26 | Disposition: A | Discharge status: Home / Self Care | Payer: Medicare Other | Source: Ambulatory Visit | Attending: Interventional Radiology | Admitting: Interventional Radiology

## 2013-04-26 ENCOUNTER — Other Ambulatory Visit: Payer: Self-pay | Admitting: Internal Medicine

## 2013-04-26 ENCOUNTER — Ambulatory Visit (INDEPENDENT_AMBULATORY_CARE_PROVIDER_SITE_OTHER): Payer: Medicare Other | Admitting: Endocrinology

## 2013-04-26 ENCOUNTER — Encounter: Payer: Self-pay | Admitting: Endocrinology

## 2013-04-26 VITALS — BP 120/60 | HR 98 | Temp 98.5°F | Resp 14 | Ht 72.0 in | Wt 188.7 lb

## 2013-04-26 DIAGNOSIS — E278 Other specified disorders of adrenal gland: Secondary | ICD-10-CM

## 2013-04-26 DIAGNOSIS — I10 Essential (primary) hypertension: Secondary | ICD-10-CM

## 2013-04-26 DIAGNOSIS — I823 Embolism and thrombosis of renal vein: Secondary | ICD-10-CM

## 2013-04-26 DIAGNOSIS — E279 Disorder of adrenal gland, unspecified: Secondary | ICD-10-CM

## 2013-04-26 LAB — PNH PROFILE (-HIGH SENSITIVITY): Viability (%): 72 %

## 2013-04-26 LAB — HAPTOGLOBIN: Haptoglobin: 245 mg/dL — ABNORMAL HIGH (ref 45–215)

## 2013-04-26 LAB — IMMUNOFIXATION ELECTROPHORESIS: IgA: 148 mg/dL (ref 68–379)

## 2013-04-26 LAB — DIRECT ANTIGLOBULIN TEST (NOT AT ARMC): DAT IgG: NEGATIVE

## 2013-04-26 NOTE — Progress Notes (Signed)
Patient ID: [name], male   DOB: Sep 07, 1950, 62 y.o.   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 This was contiguous with the renal vein.  MRI showed:The left adrenal gland is markedly enlarged and appears edematous but maintains it's adreniform shape. No worrisome enhancement, discrete mass or signal loss on the out of phase images. Radiologist comment: enlargement is  likelydue to venous obstruction and engorgement.    Currently he is having problems with persistent abdominal pain  Along with decreased appetite and lack of energy. He does not complain of any nausea, diarrhea or lightheadedness   No history of  Severe hypertension, palpitations or flushing. He did have 24 urine metanephrine , VMA and  Plasma metanephrines were  normal in the hospital  And also normal cortisol level No history of unusual weight gain and central obesity No history of hypokalemia   He is now referred here for further evaluation of the adrenal enlargement   Past Medical History  Diagnosis Date  . Diabetes mellitus without complication   . Hypertension   . Arthritis 04/07/08    Degenerative Disc Disease  . Irritable bowel syndrome 04/07/08  . Glaucoma   . Peripheral vascular disease     Past Surgical History  Procedure Laterality Date  . Colonoscopy  01/11/11  . Femoral-tibial bypass graft  Aug. 7, 2009    Left  SFA to AFA  . Back surgery  2005    Family History  Problem Relation Age of Onset  . Congestive Heart Failure Mother   . Diabetes Mother   . Heart disease Mother     Coronary Disease  . Heart disease Father     Coronary Disease  with Bypass Surgery  . Diabetes Paternal Aunt   . Stroke Paternal Aunt   . Diabetes Paternal Uncle   . Stroke Paternal Uncle     Social History:  reports that he quit smoking about 2 weeks ago. His smoking use included Cigarettes. He smoked 0.50 packs per day. He has never used smokeless tobacco. He reports that he uses illicit drugs (Hydrocodone and Other-see comments) about once per week. His alcohol history is not on file.  Allergies: No Known Allergies    Medication List       This list is accurate as of: 04/26/13  2:30 PM.  Always use your most recent med list.               acidophilus Caps capsule  Take 1 capsule by mouth daily.     amLODipine 10 MG tablet  Commonly known as:  NORVASC  Take 10 mg by mouth daily.     brimonidine 0.2 % ophthalmic solution  Commonly known as:  ALPHAGAN  Place 1 drop into both eyes 3 (three) times daily.     dorzolamide-timolol 22.3-6.8 MG/ML ophthalmic solution  Commonly known as:  COSOPT     HYDROcodone-acetaminophen 5-325 MG per tablet  Commonly known as:  NORCO/VICODIN  Take 1 tablet by mouth every 8 (eight) hours as needed.     latanoprost 0.005 % ophthalmic solution  Commonly known as:  XALATAN  Place 1 drop into both eyes at bedtime.     losartan-hydrochlorothiazide 100-25 MG per tablet  Commonly known as:  HYZAAR     metoCLOPramide 10 MG tablet  Commonly known as:  REGLAN  Take 10 mg by mouth 4 (four) times daily.     metoprolol tartrate 25 MG tablet  Commonly known as:  LOPRESSOR  Take 1 tablet (25 mg total) by mouth 2 (two) times daily.     polyethylene glycol packet  Commonly known as:  MIRALAX / GLYCOLAX  Take 17 g by mouth daily as needed.     Rivaroxaban 15 MG Tabs tablet  Commonly known as:  XARELTO  Take 1 tablet (15 mg total) by mouth 2 (two) times daily with a meal. You will need to get dose adjusted after 21 days by her primary care physician        Appointment on 04/21/2013  Component Date Value  Range Status  . Total Protein, Serum Electrophores* 04/21/2013 8.3  6.0 - 8.3 g/dL Preliminary  . IgG (Immunoglobin G), Serum 04/21/2013 1400  650 - 1600 mg/dL Preliminary  . IgA 04/21/2013 148  68 - 379 mg/dL Preliminary  . IgM, Serum 04/21/2013 <5* 41 - 251 mg/dL Preliminary  . Immunofix Electr Int 04/21/2013 *   Preliminary   No monoclonal protein identified.Reviewed by Dallas Breeding, MD, PhD, FCAP (Electronic Signature onFile)  . RBC Comments 04/21/2013 Within Normal Limits  Within Normal Limits Final  . White Cell Comments 04/21/2013 occ Variant Lymphs   Final  . PLT EST 04/21/2013 Adequate  Adequate Final  . Platelet Morphology 04/21/2013 Large Platelets  Within Normal Limits Final  . Haptoglobin 04/21/2013 245* 45 - 215 mg/dL Preliminary  . DAT IgG 04/21/2013 NEG  NEGATIVE Preliminary  . DAT (Complement) 04/21/2013 NEG  NEGATIVE Preliminary  . WBC 04/21/2013 7.5  4.0 - 10.3 10e3/uL Final  . NEUT# 04/21/2013 4.7  1.5 - 6.5 10e3/uL Final  . HGB 04/21/2013 9.7* 13.0 - 17.1 g/dL Final  . HCT 40/98/1191 30.5* 38.4 - 49.9 % Final  . Platelets 04/21/2013 239  140 - 400 10e3/uL Final  . MCV 04/21/2013 92.7  79.3 - 98.0 fL Final  . MCH 04/21/2013 29.5  27.2 - 33.4 pg Final  . MCHC 04/21/2013 31.8* 32.0 - 36.0 g/dL Final  . RBC 47/82/9562 3.29* 4.20 - 5.82 10e6/uL Final  . RDW 04/21/2013 14.2  11.0 - 14.6 % Final  . lymph# 04/21/2013 1.6  0.9 - 3.3 10e3/uL Final  . MONO# 04/21/2013 0.7  0.1 - 0.9 10e3/uL Final  . Eosinophils Absolute 04/21/2013 0.4  0.0 - 0.5 10e3/uL Final  . Basophils Absolute 04/21/2013 0.1  0.0 - 0.1 10e3/uL Final  . NEUT% 04/21/2013 62.5  39.0 - 75.0 % Final  . LYMPH% 04/21/2013 21.8  14.0 - 49.0 % Final  . MONO% 04/21/2013 9.6  0.0 - 14.0 % Final  . EOS% 04/21/2013 5.2  0.0 - 7.0 % Final  . BASO% 04/21/2013 0.9  0.0 - 2.0 % Final  . Retic % 04/21/2013 3.32* 0.80 - 1.80 % Final  . Retic Ct Abs 04/21/2013 109.23* 34.80 - 93.90 10e3/uL Final  . Immature Retic  Fract 04/21/2013 20.60* 3.00 - 10.60 % Final     REVIEW OF SYSTEMS:           No unusual headaches.  Skin: No rash or  Unusual pigmentation or ecchymoses     Thyroid:  No  History  of thyroid disease     His blood pressure has been  High in the past and treated with amlodipine  And Hyzaar     No swelling of feet.     No shortness of breath on exertion.     Bowel habits:   normal      No joint  pains.    PHYSICAL EXAM:  BP 120/60  Pulse 98  Temp(Src) 98.5 F (36.9 C)  Resp 14  Ht 6' (1.829 m)  Wt 188 lb 11.2 oz (85.594 kg)  BMI 25.59 kg/m2  SpO2 98%  GENERAL: Well-built and nourished  No pallor, clubbing, lymphadenopathy or edema.  Skin:  no rash or pigmentation.  EYES:  Externally normal.  Fundii:  normal discs and vessels.  ENT: Oral mucosa and tongue normal. No mucosal pigmentation  THYROID:  Not palpable.  HEART:  Normal apex, S1 and S2; no murmur or click.  CHEST:  Normal shape Lungs:    Vescicular breath sounds heard equally.  No crepitations/ wheeze.  ABDOMEN:  Mild generalized fullness present. He has mild generalized tenderness and does not allow palpation easily with some guarding but no rebound as assessed by percussion. Bowel sounds appeared normal.  Liver and spleen not palpable.  No other mass palpable  Neurological exam: .Reflexes are normal bilaterally at biceps .  Extremities: No edema, no clubbing or abnormal pigmentation  SPINE AND JOINTS:  Normal.   ASSESSMENT:    The adrenal gland appears to be engorged as a result of his renal vein thrombosis. Apparently the cause of this is idiopathic. The radiologist appears to think that since the shape and density of the adrenal gland is normal this is unlikely to be a tumor   Do not think that he has a functioning adrenal lesion since he has no history of recurrent hypokalemia, no clinical signs or symptoms of Cushing's disease or labile blood pressure. Has had normal urine metanephrines  done  PLAN:   Urine free cortisol to assess adrenal cortical function. However this can be deferred until his pain has resolved  He will followup with PCP for multiple other problems especially continuing abdominal pain and also significant anemia of unclear etiology  Krithi Bray 04/26/2013, 2:30 PM

## 2013-04-26 NOTE — Patient Instructions (Signed)
Collect 24 hr urine in jug when pain improved

## 2013-04-27 LAB — UIFE/LIGHT CHAINS/TP QN, 24-HR UR
Albumin, U: DETECTED
Free Kappa Lt Chains,Ur: 1.38 mg/dL (ref 0.14–2.42)
Free Kappa/Lambda Ratio: 2.3 ratio (ref 2.04–10.37)
Free Lambda Excretion/Day: 6.6 mg/d
Free Lambda Lt Chains,Ur: 0.6 mg/dL (ref 0.02–0.67)
Gamma Globulin, Urine: DETECTED — AB
Time: 24 hours
Total Protein, Urine: 22 mg/dL
Volume, Urine: 1100 mL

## 2013-04-28 ENCOUNTER — Other Ambulatory Visit (HOSPITAL_COMMUNITY): Payer: Self-pay | Admitting: Interventional Radiology

## 2013-04-28 DIAGNOSIS — I823 Embolism and thrombosis of renal vein: Secondary | ICD-10-CM

## 2013-05-01 NOTE — Progress Notes (Signed)
ID: Delma Freeze OB: 08-17-1951  MR#: [id number]  WUJ#:[id number]  [facility]  [phone] [phone]   OFFICE PROGRESS NOTE  PCP: Geraldo Pitter, MD GYN:   SU:  OTHER MD: DIAGNOSIS: Renal vein thrombosis.  HISTORY OF PRESENT ILLNESS: patient  Is a 62 y.o. Man who was sent to clinic because of history of renal vein thrombosis. He started to have abdominal pain for 2 months before he presented to ED. His pain gradually became worse and he presented to ED. CT scan abdomen and pelvis was done which showed renal vein thrombosis. Patient was started on heparin drip. Latter he was on Xarelto. Patient was told to follow up with hematology. He is doing well. Except left foot tingling no other complains The patient denied fever, chills, night sweats, change in appetite or weight. He denied headaches, double vision, blurry vision, nasal congestion, nasal discharge, hearing problems, odynophagia or dysphagia. No chest pain, palpitations, dyspnea, cough, abdominal pain, nausea, vomiting, diarrhea, constipation, hematochezia. The patient denied dysuria, nocturia, polyuria, hematuria, myalgia, numbness, psychiatric problems.  PAST MEDICAL HISTORY: Past Medical History  Diagnosis Date  . Diabetes mellitus without complication   . Hypertension   . Arthritis 04/07/08    Degenerative Disc Disease  . Irritable bowel syndrome 04/07/08  . Glaucoma   . Peripheral vascular disease     PAST SURGICAL HISTORY: Past Surgical History  Procedure Laterality Date  . Colonoscopy  01/11/11  . Femoral-tibial bypass graft  Aug. 7, 2009    Left  SFA to AFA  . Back surgery  2005    FAMILY HISTORY Family History  Problem Relation Age of Onset  . Congestive Heart Failure Mother   . Diabetes Mother   . Heart disease Mother     Coronary Disease  . Heart disease Father     Coronary Disease with Bypass Surgery  . Diabetes Paternal Aunt   . Stroke Paternal Aunt   . Diabetes  Paternal Uncle   . Stroke Paternal Uncle    HEALTH MAINTENANCE: History  Substance Use Topics  . Smoking status: Former Smoker -- 0.50 packs/day    Types: Cigarettes    Quit date: 04/11/2013  . Smokeless tobacco: Never Used  . Alcohol Use: Not on file     Comment: took 1 at home on 04/02/13    No Known Allergies  Current Outpatient Prescriptions  Medication Sig Dispense Refill  . acidophilus (RISAQUAD) CAPS Take 1 capsule by mouth daily.      Marland Kitchen amLODipine (NORVASC) 10 MG tablet Take 10 mg by mouth daily.      . brimonidine (ALPHAGAN) 0.2 % ophthalmic solution Place 1 drop into both eyes 3 (three) times daily.      Marland Kitchen HYDROcodone-acetaminophen (NORCO/VICODIN) 5-325 MG per tablet Take 1 tablet by mouth every 8 (eight) hours as needed.  30 tablet  0  . latanoprost (XALATAN) 0.005 % ophthalmic solution Place 1 drop into both eyes at bedtime.      . metoCLOPramide (REGLAN) 10 MG tablet Take 10 mg by mouth 4 (four) times daily.      . metoprolol tartrate (LOPRESSOR) 25 MG tablet Take 1 tablet (25 mg total) by mouth 2 (two) times daily.  60 tablet  0  . polyethylene glycol (MIRALAX / GLYCOLAX) packet Take 17 g by mouth daily as needed.  14 each  0  . Rivaroxaban (XARELTO) 15 MG TABS tablet Take 1 tablet (15 mg total) by mouth 2 (two) times daily with  a meal. You will need to get dose adjusted after 21 days by her primary care physician  41 tablet  0  . dorzolamide-timolol (COSOPT) 22.3-6.8 MG/ML ophthalmic solution       . losartan-hydrochlorothiazide (HYZAAR) 100-25 MG per tablet        No current facility-administered medications for this visit.    OBJECTIVE: Filed Vitals:   04/21/13 1057  BP: 145/82  Pulse: 90  Temp: 97.9 F (36.6 C)  Resp: 18     Body mass index is 24.79 kg/(m^2).      PHYSICAL EXAMINATION:  HEENT: Sclerae anicteric.  Conjunctivae were pink. Pupils round and reactive bilaterally. Oral mucosa is moist without ulceration or thrush. No occipital, submandibular,  cervical, supraclavicular or axillar adenopathy. Lungs: clear to auscultation without wheezes. No rales or rhonchi. Heart: regular rate and rhythm. No murmur, gallop or rubs. Abdomen: soft.Diffuse abdominal tenderness especially prominent on left lateral aria.  No guarding or rebound tenderness. Bowel sounds are present. No palpable hepatosplenomegaly. MSK: no focal spinal tenderness. Extremities: No clubbing or cyanosis.No calf tenderness to palpitation, no peripheral edema. The patient had grossly intact strength in upper and lower extremities. Skin exam was without ecchymosis, petechiae. Neuro: non-focal, alert and oriented to time, person and place, appropriate affect  LAB RESULTS:  CMP     Component Value Date/Time   NA 140 04/11/2013 0450   K 3.8 04/11/2013 0450   CL 106 04/11/2013 0450   CO2 24 04/11/2013 0450   GLUCOSE 110* 04/11/2013 0450   BUN 12 04/11/2013 0450   CREATININE 0.93 04/11/2013 0450   CREATININE 1.09 04/04/2013 1733   CALCIUM 10.2 04/11/2013 0450   PROT 7.9 04/03/2013 0500   ALBUMIN 4.2 04/03/2013 0500   AST 17 04/03/2013 0500   ALT 16 04/03/2013 0500   ALKPHOS 118* 04/03/2013 0500   BILITOT 0.8 04/03/2013 0500   GFRNONAA 89* 04/11/2013 0450   GFRAA >90 04/11/2013 0450    I No results found for this basename: SPEP, UPEP,  kappa and lambda light chains    Lab Results  Component Value Date   WBC 7.5 04/21/2013   NEUTROABS 4.7 04/21/2013   HGB 9.7* 04/21/2013   HCT 30.5* 04/21/2013   MCV 92.7 04/21/2013   PLT 239 04/21/2013      Chemistry      Component Value Date/Time   NA 140 04/11/2013 0450   K 3.8 04/11/2013 0450   CL 106 04/11/2013 0450   CO2 24 04/11/2013 0450   BUN 12 04/11/2013 0450   CREATININE 0.93 04/11/2013 0450   CREATININE 1.09 04/04/2013 1733      Component Value Date/Time   CALCIUM 10.2 04/11/2013 0450   ALKPHOS 118* 04/03/2013 0500   AST 17 04/03/2013 0500   ALT 16 04/03/2013 0500   BILITOT 0.8 04/03/2013 0500       No results found for this basename: LABCA2      No components found with this basename: LABCA125    No results found for this basename: INR,  in the last 168 hours  Urinalysis    Component Value Date/Time   COLORURINE YELLOW 04/03/2013 0511   APPEARANCEUR CLEAR 04/03/2013 0511   LABSPEC <1.005* 04/03/2013 0511   PHURINE 6.0 04/03/2013 0511   GLUCOSEU NEGATIVE 04/03/2013 0511   HGBUR TRACE* 04/03/2013 0511   BILIRUBINUR NEGATIVE 04/03/2013 0511   KETONESUR NEGATIVE 04/03/2013 0511   PROTEINUR NEGATIVE 04/03/2013 0511   UROBILINOGEN 0.2 04/03/2013 0511   NITRITE NEGATIVE 04/03/2013 0511  LEUKOCYTESUR NEGATIVE 04/03/2013 4782    STUDIES: Mr Abdomen W Wo Contrast  04/03/2013   *RADIOLOGY REPORT*  Clinical Data: 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 Enlarged left adrenal gland.  Assess for possible adrenal mass  MRI ABDOMEN WITH AND WITHOUT CONTRAST  Technique:  Multiplanar multisequence MR imaging of the abdomen was performed both before and after administration of intravenous contrast.  Contrast: 20mL MULTIHANCE GADOBENATE DIMEGLUMINE 529 MG/ML IV SOLN  Comparison: CT scan 04/03/2013.  Findings: As demonstrated on the CT scan there is extensive occlusive clot in the IVC.  This begins at the L3 level and extends into the intrahepatic IVC.  It ends approximately 3 cm below the right hemi diaphragm.  There is also extensive obstructing thrombus in the left renal vein.  No worrisome left renal mass to suggest renal cell carcinoma.  There are a few simple appearing left renal cyst.  The left adrenal gland is markedly enlarged and appears edematous but maintains it's adreniform shape.  No worrisome enhancement, discrete mass or signal loss on the out of phase images.  I suspect the enlargement is due to venous obstruction and engorgement.  The liver is unremarkable.  No focal hepatic lesions or intrahepatic biliary dilatation.  The hepatic veins are patent. The portal vein is patent.  The gallbladder is normal.  No common bile duct dilatation.  The pancreas  is normal.  The spleen is normal.  Advanced atherosclerotic calcification involving the aorta. The major branch vessels are patent.  No mesenteric or retroperitoneal mass or adenopathy.  Fairly extensive left perirenal collateral vessels are noted.  IMPRESSION:  1.  IVC and left renal vein thrombosis as discussed above.  This is most likely a primary process of IVC thrombosis subsequently occluding the left renal vein and left adrenal vein.  The left adrenal gland is enlarged most likely due to venous obstruction and engorgement.  No definite mass.  Recommend treatment for the IVC thrombosis and reassessment of the adrenal gland after the thrombosis has resolved. 2.  Renal cysts but no renal mass lesions. 3.  Patent right renal vein.   Original Report Authenticated By: Rudie Meyer, M.D.   Ct Abdomen Pelvis W Contrast  04/03/2013   *RADIOLOGY REPORT*  Clinical Data: Abdominal pain.  CT ABDOMEN AND PELVIS WITH CONTRAST  Technique:  Multidetector CT imaging of the abdomen and pelvis was performed following the standard protocol during bolus administration of intravenous contrast.  Contrast: 50mL OMNIPAQUE IOHEXOL 300 MG/ML  SOLN, OMNIPAQUE IOHEXOL 300 MG/ML  SOLN  Comparison: 04/29/2007.  Findings:  BODY WALL: Unremarkable.  LOWER CHEST:  Mediastinum: Unremarkable.  Lungs/pleura: No consolidation.  ABDOMEN/PELVIS:  Liver: No focal abnormality.  Biliary: No evidence of biliary obstruction or stone.  Pancreas: Unremarkable.  Spleen: Unremarkable.  Adrenals: Diffuse enlargement indistinct appearance of the adrenal gland, measuring at least 5 cm in length by 2.6 cm in thickness. The abnormal adrenal gland is in continuity with left renal vein thrombosis, and may be the source.  The thrombus extends across the midline into the IVC, up to the hepatic, infrahepatic junction. On delayed imaging, the thrombus appears low density, arguing against thrombus enhancement.  Kidneys and ureters: Despite the venous thrombosis,  there is nearly symmetric renal enhancement, with slight retention the left.  2 left renal cysts of both around 3.2 cm in maximal dimension.  No hydronephrosis.  Hilar calcifications are likely arterial atherosclerotic.  Bladder: Unremarkable.  Bowel: No obstruction. Normal appendix.  Retroperitoneum: Retroperitoneal stranding, reactive to  the thrombus.  Peritoneum: No free fluid or gas.  Reproductive: Serpiginous high density in the left spermatic cord, likely retained venous contrast in the setting of downstream thrombosis.  Vascular: Left renal vein thrombosis as above, with extension into the IVC.  The caval thrombus extends from the level of the renal hila to the junction of the hepatic and infrahepatic cava.  The thrombus occupies over 50% of the luminal diameter.  Extensive aortic and branch vessel atherosclerotic  OSSEOUS: No acute abnormalities. Transitional lumbosacral anatomy. Status post interbody fusion at L4-5 and L5-L1, with laminectomies. L4-5 posterior rod and screw fixation.  Remote appearing L3 superior end plate fracture.  Critical Value/emergent results were called by telephone at the time of interpretation on 04/03/2013 at 07:10 a.m. to Dr Preston Fleeting, who verbally acknowledged these results.  IMPRESSION: Left renal vein thrombosis with extensive, nearly occlusive IVC thrombus.  Suspect causative mass in the enlarged left adrenal gland (versus edematous change from venous outflow obstruction). Recommend multiphase abdominal MRI.   Original Report Authenticated By: Tiburcio Pea   [facility]  04/28/2013   *RADIOLOGY REPORT*  NEW PATIENT OFFICE VISIT - LEVEL III [phone])  Chief Complaint:  Recent hospital admission from 04/03/2013 to 04/11/2013 for left-sided renal vein thrombosis with extension of thrombus into the IVC.  History:  Mr. Friedlander has been referred for followup after hospital admission by Dr. Susa Raring of the [facility].  He was admitted and treated  recently for left renal vein and IVC thrombosis causing acute left abdominal pain and renal insufficiency.  At that time, imaging also showed enlargement and engorgement of the left adrenal gland.  I was consulted at that time to review imaging for possible need of left adrenal biopsy. It was felt by imaging that enlargement of the left adrenal gland is likely related to engorgement and venous congestion and there was no evidence of an enhancing tumor by MRI.  It was elected not to biopsy the adrenal gland while the patient was in the hospital.  The patient was treated with IV heparin and is now on daily Xarelto.  Renal function returned to baseline.  The patient was discharged from the hospital on 04/11/2013.  He has followed up since that time with his primary care physician Dr. Renaye Rakers. His primary complaint is persistent left-sided abdominal and flank pain which she describes as constant and not adequately relieved by Vicodin.  The pain is affecting his ability to sleep.  Past Medical History: 1.  History of type 2 diabetes 2.  History of hypertension 3.  History of peripheral vascular disease with prior left lower extremity bypass surgery in 2009 4.  History of degenerative disc disease of the spine with prior spinal surgery 5.  History of irritable bowel syndrome 6.  History of glaucoma  Medications:  Norvasc 10 mg daily, Alphagan solution in both eyes three times daily, Vicodin every 8 hours as needed, Reglan 10 mg four times daily, Lopressor 25 mg two times daily, MiraLax 17 grams as needed and Xarelto 15 mg two times daily.  Allergies:  No known drug allergies.  Social History:  The patient is married and has two children.  He lives in [place], [place]. He is on disability and not working.  He denies alcohol use.  He quit smoking approximately 1 month ago.  Family History:  Mother with history of diabetes, heart disease and CHF.  Father with history of coronary artery disease.  Uncles and aunts with history  of diabetes and stroke.  Review of Systems:  General:  Height 6 feet 2 inches, weight 193 pounds.  Gastrointestinal:  Some constipation and abdominal pain. History of irritable bowel syndrome.  Genitourinary:  No difficulty urinating, hematuria or dysuria.   Cardiovascular:  No chest pain. Musculoskeletal:  Chronic low back and neck pain.  Neurologic:  No neurologic symptoms.  Respiratory:  No shortness of breath, cough or hemoptysis.  Exam:  Vital signs:  Blood pressure 110/77, pulse 96, respirations 17, temperature 98.6, oxygen saturation 99% on room air.  General: Appears uncomfortable. Chest:  Clear to auscultation bilaterally. Heart:  Regular rate and rhythm.  Abdomen:  Soft and nondistended. Some tenderness is elicited in the left lateral abdomen and flank region.  Extremities:  No lower extremity edema.  Labs:  WBC 7.5, hemoglobin 9.7, hematocrit 30.5, platelet count 239, sodium 140, glucose 110, BUN 12, creatinine 0.93, estimated GFR greater than 90 ml/minute.  Metanephrine panel negative.  Imaging:  CT of the abdomen pelvis on 04/03/2013 was reviewed as well as MRI of the abdomen with contrast also on 04/03/2013.  Assessment and Plan:  I met with Mr. Tyler Deis and reviewed imaging findings with him.  His only complaint currently is continued significant left-sided abdominal pain.  Imaging shows thrombosis of the left renal vein as well as diffuse enlargement of the left adrenal gland which may be secondary to venous congestion.  A discrete enhancing mass is not detected by MRI.  Previous imaging demonstrated extension of thrombus into the IVC without complete occlusion of the IVC.  The patient currently has no lower extremity edema or lower extremity complaints to suggest progressive caval thrombosis.  Workup thus far has been negative for chemical evidence of pheochromocytoma.  The patient is scheduled to see Dr. Lucianne Muss later today for endocrinological evaluation.  I did recommend that we perform a follow-up  MRI with Gadolinium in the next few weeks to recheck the appearance of the adrenal gland as well as patency of the left renal vein and IVC.  I would only recommend left adrenal biopsy if there is convincing evidence of a progressive mass lesion.  I did recommend that the patient follow up with Dr. Parke Simmers for management of continued abdominal pain.  We will help to set up a follow-up MRI of the abdomen with Gadolinium and I will follow up with the patient after the MRI is performed.   Original Report Authenticated By: Irish Lack, M.D.    ASSESSMENT AND PLAN: 1. Renal vain thrombosis. The patient will require life long anticoagulation. Agree with Xarelto   2. Anemia I will check for PNH, hemolysis, monoclonal protein. Follow up in 2 weeks.   Myra Rude, MD   04/21/2013 8:19 PM

## 2013-05-05 ENCOUNTER — Ambulatory Visit: Payer: Medicare Other | Admitting: Hematology and Oncology

## 2013-05-13 ENCOUNTER — Telehealth: Payer: Self-pay | Admitting: Emergency Medicine

## 2013-05-13 NOTE — Telephone Encounter (Signed)
Called pt again to get him scheduled for his MRI ABD to assess renal veins/IVC thrombosis.  Pt. States that he went to [facility] and "they found the problem, and I will continue to go there".  - Will notify Dr. Fredia Sorrow.

## 2013-05-17 ENCOUNTER — Other Ambulatory Visit (HOSPITAL_COMMUNITY): Payer: Self-pay | Admitting: Internal Medicine

## 2013-05-27 ENCOUNTER — Ambulatory Visit: Payer: Medicare Other | Admitting: Endocrinology

## 2013-05-27 DIAGNOSIS — Z0289 Encounter for other administrative examinations: Secondary | ICD-10-CM

## 2013-08-29 ENCOUNTER — Emergency Department (HOSPITAL_COMMUNITY): Payer: Medicare Other

## 2013-08-29 ENCOUNTER — Emergency Department (HOSPITAL_COMMUNITY)
Admission: EM | Admit: 2013-08-29 | Discharge: 2013-08-30 | Disposition: A | Discharge status: Other Acute Inpatient Hospital | Payer: Medicare Other | Attending: Emergency Medicine | Admitting: Emergency Medicine

## 2013-08-29 ENCOUNTER — Encounter (HOSPITAL_COMMUNITY): Payer: Self-pay | Admitting: Emergency Medicine

## 2013-08-29 DIAGNOSIS — C7800 Secondary malignant neoplasm of unspecified lung: Secondary | ICD-10-CM | POA: Insufficient documentation

## 2013-08-29 DIAGNOSIS — Z8719 Personal history of other diseases of the digestive system: Secondary | ICD-10-CM | POA: Insufficient documentation

## 2013-08-29 DIAGNOSIS — R569 Unspecified convulsions: Secondary | ICD-10-CM

## 2013-08-29 DIAGNOSIS — Z7901 Long term (current) use of anticoagulants: Secondary | ICD-10-CM | POA: Insufficient documentation

## 2013-08-29 DIAGNOSIS — E119 Type 2 diabetes mellitus without complications: Secondary | ICD-10-CM | POA: Insufficient documentation

## 2013-08-29 DIAGNOSIS — Z86718 Personal history of other venous thrombosis and embolism: Secondary | ICD-10-CM | POA: Insufficient documentation

## 2013-08-29 DIAGNOSIS — Z79899 Other long term (current) drug therapy: Secondary | ICD-10-CM | POA: Insufficient documentation

## 2013-08-29 DIAGNOSIS — Z87891 Personal history of nicotine dependence: Secondary | ICD-10-CM | POA: Insufficient documentation

## 2013-08-29 DIAGNOSIS — I10 Essential (primary) hypertension: Secondary | ICD-10-CM | POA: Insufficient documentation

## 2013-08-29 DIAGNOSIS — H409 Unspecified glaucoma: Secondary | ICD-10-CM | POA: Insufficient documentation

## 2013-08-29 DIAGNOSIS — IMO0002 Reserved for concepts with insufficient information to code with codable children: Secondary | ICD-10-CM | POA: Insufficient documentation

## 2013-08-29 DIAGNOSIS — G40909 Epilepsy, unspecified, not intractable, without status epilepticus: Secondary | ICD-10-CM | POA: Insufficient documentation

## 2013-08-29 DIAGNOSIS — G8384 Todd's paralysis (postepileptic): Secondary | ICD-10-CM

## 2013-08-29 DIAGNOSIS — G8389 Other specified paralytic syndromes: Secondary | ICD-10-CM | POA: Insufficient documentation

## 2013-08-29 DIAGNOSIS — C7931 Secondary malignant neoplasm of brain: Secondary | ICD-10-CM

## 2013-08-29 DIAGNOSIS — M129 Arthropathy, unspecified: Secondary | ICD-10-CM | POA: Insufficient documentation

## 2013-08-29 DIAGNOSIS — C719 Malignant neoplasm of brain, unspecified: Secondary | ICD-10-CM | POA: Insufficient documentation

## 2013-08-29 HISTORY — DX: Malignant (primary) neoplasm, unspecified: C80.1

## 2013-08-29 LAB — COMPREHENSIVE METABOLIC PANEL
ALT: 21 U/L (ref 0–53)
AST: 45 U/L — ABNORMAL HIGH (ref 0–37)
Albumin: 3.2 g/dL — ABNORMAL LOW (ref 3.5–5.2)
Alkaline Phosphatase: 401 U/L — ABNORMAL HIGH (ref 39–117)
Calcium: 10.7 mg/dL — ABNORMAL HIGH (ref 8.4–10.5)
GFR calc Af Amer: 78 mL/min — ABNORMAL LOW (ref >90)
Glucose, Bld: 154 mg/dL — ABNORMAL HIGH (ref 70–99)
Potassium: 4.8 mEq/L (ref 3.5–5.1)
Sodium: 133 mEq/L — ABNORMAL LOW (ref 135–145)
Total Bilirubin: 1.1 mg/dL (ref 0.3–1.2)
Total Protein: 7.7 g/dL (ref 6.0–8.3)

## 2013-08-29 LAB — CBC WITH DIFFERENTIAL/PLATELET
Basophils Relative: 1 % (ref 0–1)
Eosinophils Relative: 1 % (ref 0–5)
Lymphocytes Relative: 12 % (ref 12–46)
Lymphs Abs: 0.5 10*3/uL — ABNORMAL LOW (ref 0.7–4.0)
MCV: 86.6 fL (ref 78.0–100.0)
Monocytes Relative: 8 % (ref 3–12)
Platelets: 54 10*3/uL — ABNORMAL LOW (ref 150–400)
RBC: 2.61 MIL/uL — ABNORMAL LOW (ref 4.22–5.81)
WBC: 4.2 10*3/uL (ref 4.0–10.5)

## 2013-08-29 LAB — CG4 I-STAT (LACTIC ACID): Lactic Acid, Venous: 8.66 mmol/L — ABNORMAL HIGH (ref 0.5–2.2)

## 2013-08-29 MED ORDER — SODIUM CHLORIDE 0.9 % IV BOLUS (SEPSIS)
1000.0000 mL | Freq: Once | INTRAVENOUS | Status: AC
  Administered 2013-08-29: 1000 mL via INTRAVENOUS

## 2013-08-29 NOTE — ED Notes (Signed)
Wife states patient took 1-60mg  Morphine ER, 1-15mg  Morphine, and 2-100mg  Gabapentin PO at 1710. Noticed at 1835 that patient was complaining of difficulty breathing and "fell out" while walking to bathroom. Patient alert and oriented at this time.

## 2013-08-29 NOTE — ED Provider Notes (Signed)
CSN: [id number]     Arrival date & time 08/29/13  2002 History  This chart was scribed for Gerhard Munch, MD by Karle Plumber, ED Scribe. This patient was seen in room APA08/APA08 and the patient's care was started at 8:23 PM.  Chief Complaint  Patient presents with  . Drug Overdose   The history is provided by the patient. No language interpreter was used.   HPI Comments:  [name] is a 62 y.o. male brought in by ambulance, who presents to the Emergency Department complaining of altered mental status. Pt's wife states pt took Morphine IR and SR and two Gabapentin capsules approximately four hours ago. Pt's wife states he fell down and was experiencing weakness in his lower extremities and was minimally responsive. Pt's wife states the episode lasted approximately 5 minutes. He states he is having weakness in his right foot and leg. He denies pain, SOB or difficulty breathing. Pt reports h/o lung cancer with his last chemo treatment 6 days ago. 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  Past Medical History  Diagnosis Date  . Diabetes mellitus without complication   . Hypertension   . Arthritis 04/07/08    Degenerative Disc Disease  . Irritable bowel syndrome 04/07/08  . Glaucoma   . Peripheral vascular disease   . Cancer    Past Surgical History  Procedure Laterality Date  . Colonoscopy  01/11/11  . Femoral-tibial bypass graft  Aug. 7, 2009    Left  SFA to AFA  . Back surgery  2005   Family History  Problem Relation Age of Onset  . Congestive Heart Failure Mother   . Diabetes Mother   . Heart disease Mother     Coronary Disease  . Heart disease Father     Coronary Disease with Bypass Surgery  . Diabetes Paternal Aunt   . Stroke Paternal Aunt   . Diabetes Paternal Uncle   . Stroke Paternal Uncle    History  Substance Use Topics  . Smoking status: Former Smoker -- 0.50 packs/day    Types: Cigarettes    Quit date: 04/11/2013  . Smokeless  tobacco: Never Used  . Alcohol Use: Not on file     Comment: took 1 at home on 04/02/13    Review of Systems  Constitutional:       Per HPI, otherwise negative  HENT:       Per HPI, otherwise negative  Respiratory:       Per HPI, otherwise negative  Cardiovascular:       Per HPI, otherwise negative  Gastrointestinal: Negative for vomiting.  Endocrine:       Negative aside from HPI  Genitourinary:       Neg aside from HPI   Musculoskeletal:       Per HPI, otherwise negative  Skin: Negative.   Neurological: Negative for syncope.    Allergies  Review of patient's allergies indicates no known allergies.  Home Medications   Current Outpatient Rx  Name  Route  Sig  Dispense  Refill  . acidophilus (RISAQUAD) CAPS   Oral   Take 1 capsule by mouth daily.         Marland Kitchen amLODipine (NORVASC) 10 MG tablet   Oral   Take 10 mg by mouth daily.         . brimonidine (ALPHAGAN) 0.2 % ophthalmic solution   Both Eyes   Place 1 drop into both eyes 3 (three) times  daily.         . dorzolamide-timolol (COSOPT) 22.3-6.8 MG/ML ophthalmic solution               . HYDROcodone-acetaminophen (NORCO/VICODIN) 5-325 MG per tablet   Oral   Take 1 tablet by mouth every 8 (eight) hours as needed.   30 tablet   0   . latanoprost (XALATAN) 0.005 % ophthalmic solution   Both Eyes   Place 1 drop into both eyes at bedtime.         Marland Kitchen losartan-hydrochlorothiazide (HYZAAR) 100-25 MG per tablet               . metoCLOPramide (REGLAN) 10 MG tablet   Oral   Take 10 mg by mouth 4 (four) times daily.         . metoprolol tartrate (LOPRESSOR) 25 MG tablet   Oral   Take 1 tablet (25 mg total) by mouth 2 (two) times daily.   60 tablet   0   . polyethylene glycol (MIRALAX / GLYCOLAX) packet   Oral   Take 17 g by mouth daily as needed.   14 each   0   . Rivaroxaban (XARELTO) 15 MG TABS tablet   Oral   Take 1 tablet (15 mg total) by mouth 2 (two) times daily with a meal. You will  need to get dose adjusted after 21 days by her primary care physician   41 tablet   0    Triage Vitals: BP 89/57  Pulse 53  Temp(Src) 97.2 F (36.2 C) (Oral)  Resp 20  Ht 6\' 2"  (1.88 m)  Wt 190 lb (86.183 kg)  BMI 24.38 kg/m2  SpO2 100% Physical Exam  Nursing note and vitals reviewed. Constitutional: He is oriented to person, place, and time. He appears well-developed. No distress.  HENT:  Head: Normocephalic and atraumatic.  Eyes: Conjunctivae and EOM are normal.  Cardiovascular: Normal rate, regular rhythm, normal heart sounds and intact distal pulses.   Good cap refill  Pulmonary/Chest: Effort normal and breath sounds normal. No stridor. No respiratory distress.  Abdominal: He exhibits no distension.  Musculoskeletal: He exhibits no edema.  Neurological: He is alert and oriented to person, place, and time.  Sensations intact, but cannot move his right foot.   Skin: Skin is warm and dry.  Psychiatric: He has a normal mood and affect.    ED Course  Procedures (including critical care time) DIAGNOSTIC STUDIES: Oxygen Saturation is 100% on RA, normal by my interpretation.   COORDINATION OF CARE: 8:27 PM- Will obtain blood work. Pt verbalizes understanding and agrees to plan.  10:47 PM- Informed pt of hemorrhagic brain metastases. Will admit pt to hospital. Will call pt's neurologist Dr. Annett Gula at [facility].   Medications  sodium chloride 0.9 % bolus 1,000 mL (1,000 mLs Intravenous New Bag/Given 08/29/13 2104)    Labs Review Labs Reviewed  CBC WITH DIFFERENTIAL - Abnormal; Notable for the following:    RBC 2.61 (*)    Hemoglobin 7.2 (*)    HCT 22.6 (*)    RDW 19.0 (*)    Platelets 54 (*)    Neutrophils Relative % 78 (*)    Lymphs Abs 0.5 (*)    All other components within normal limits  COMPREHENSIVE METABOLIC PANEL - Abnormal; Notable for the following:    Sodium 133 (*)    Chloride 90 (*)    Glucose, Bld 154 (*)    Calcium 10.7 (*)    Albumin  3.2 (*)     AST 45 (*)    Alkaline Phosphatase 401 (*)    GFR calc non Af Amer 67 (*)    GFR calc Af Amer 78 (*)    All other components within normal limits  CG4 I-STAT (LACTIC ACID) - Abnormal; Notable for the following:    Lactic Acid, Venous 8.66 (*)    All other components within normal limits  LIPASE, BLOOD  TROPONIN I   Imaging Review Ct Head Wo Contrast  08/29/2013   CLINICAL DATA:  Lung cancer.  Altered mental status.  EXAM: CT HEAD WITHOUT CONTRAST  TECHNIQUE: Contiguous axial images were obtained from the base of the skull through the vertex without intravenous contrast.  COMPARISON:  09/11/2002  FINDINGS: There is a 6.7 mm hyperdense focus in the subcortical white matter of the right frontal lobe which may reflect a hemorrhagic contusion in the setting of trauma versus a hemorrhagic mass. There is no significant surrounding vasogenic edema. There is no evidence of mass effect, midline shift or extra-axial fluid collections. There is no evidence of a cortical-based area of acute infarction.  The ventricles and sulci are appropriate for the patient's age. The basal cisterns are patent.  Visualized portions of the orbits are unremarkable. The visualized portions of the paranasal sinuses and mastoid air cells are unremarkable.  The osseous structures are unremarkable.  IMPRESSION: There is a 6.7 mm hyperdense focus in the subcortical white matter of the right frontal lobe which may reflect a hemorrhagic contusion in the setting of trauma versus a hemorrhagic mass. There is no significant surrounding vasogenic edema. Recommend further evaluation with an MRI of the brain without and with intravenous contrast.   Electronically Signed   By: Elige Ko   On: 08/29/2013 21:52    EKG Interpretation    Date/Time:  Monday August 29 2013 20:54:34 EST Ventricular Rate:  118 PR Interval:  122 QRS Duration: 80 QT Interval:  338 QTC Calculation: 473 R Axis:   65 Text Interpretation:  Sinus  tachycardia with Premature atrial complexes Nonspecific T wave abnormality Abnormal ECG When compared with ECG of 03-Apr-2013 04:59, Premature ventricular complexes are no longer Present Premature atrial complexes are now Present Nonspecific T wave abnormality now evident in Inferior leads Nonspecific T wave abnormality now evident in Lateral leads Sinus tachycardia Artifact Premature atrial complexes Abnormal ekg Confirmed by Gerhard Munch  MD [phone]) on 08/30/2013 12:39:12 AM           Update: Patient no longer has foot paralysis. MDM  No diagnosis found.  I personally performed the services described in this documentation, which was scribed in my presence. The recorded information has been reviewed and is accurate.   Patient presents after likely seizure, with ongoing paralysis of the left foot.  Notably, the patient has ongoing therapy for lung cancer, though no previously diagnosed pain metastases.  With his new events this was an immediate initial suspicion.  Patient's evaluation is notable for demonstration of hemorrhagic lesion in the brain consistent with this entity.  Patient is currently taking blood thinning medication.  After CT results, I discussed his case with both our neurosurgeon, and the patient's oncology team.  With this no bleeding, he required admission.  Patient was transferred to his primary care Hospital, [facility], for further evaluation, management of his hemorrhagic brain metastases.  CRITICAL CARE Performed by: Gerhard Munch Total critical care time: 45 Critical care time was exclusive of separately billable procedures and treating other patients.  Critical care was necessary to treat or prevent imminent or life-threatening deterioration. Critical care was time spent personally by me on the following activities: development of treatment plan with patient and/or surrogate as well as nursing, discussions with consultants, evaluation of patient's response to  treatment, examination of patient, obtaining history from patient or surrogate, ordering and performing treatments and interventions, ordering and review of laboratory studies, ordering and review of radiographic studies, pulse oximetry and re-evaluation of patient's condition.   Gerhard Munch, MD 08/30/13 [phone]

## 2013-08-29 NOTE — ED Notes (Signed)
Patient presents to ER via [facility].  EMS states they were called for respiratory difficulty; wife states patient took both pain medications as same time, but was supposed to be spaced out.

## 2013-10-02 DEATH — deceased

## 2013-10-04 ENCOUNTER — Encounter: Payer: Self-pay | Admitting: Family

## 2013-10-05 ENCOUNTER — Inpatient Hospital Stay (HOSPITAL_COMMUNITY): Admission: RE | Admit: 2013-10-05 | Payer: Medicare Other | Source: Ambulatory Visit

## 2013-10-05 ENCOUNTER — Ambulatory Visit: Payer: Medicare Other | Admitting: Family

## 2013-10-28 ENCOUNTER — Telehealth: Payer: Self-pay

## 2013-10-28 NOTE — Telephone Encounter (Signed)
Patient past away per Obituary in [place] [employer] °

## 2015-12-19 IMAGING — CT CT HEAD W/O CM
1 series · 15 of 30 positions shown, 19 images · non-contrast
Comparison: 09/11/2002

CLINICAL DATA: Lung cancer.  Altered mental status.

EXAM:
CT HEAD WITHOUT CONTRAST
TECHNIQUE: Contiguous axial images were obtained from the base of the skull
through the vertex without intravenous contrast.

[Series 2: headtrauma 4.8 h37s · axial · 0.46mm/px · z∈[+107,+270]mm · 15 of 36 slices shown, 19 images]
[im 2/36  brain]
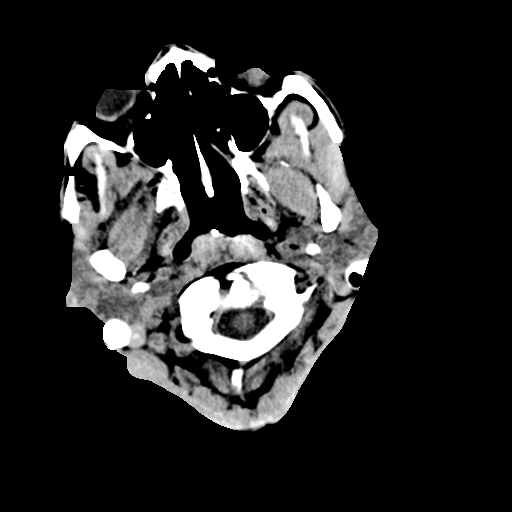
[im 2/36  bone]
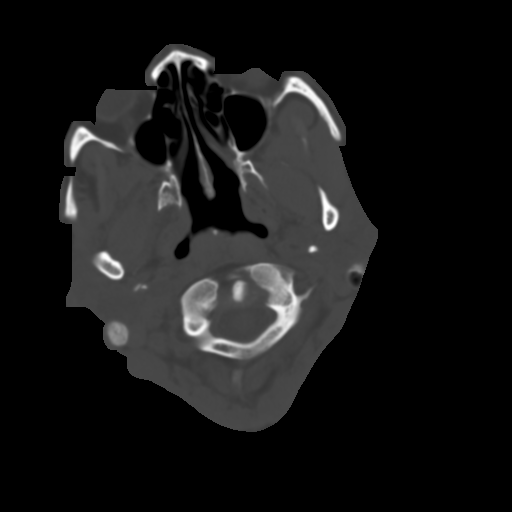
[im 4/36  brain]
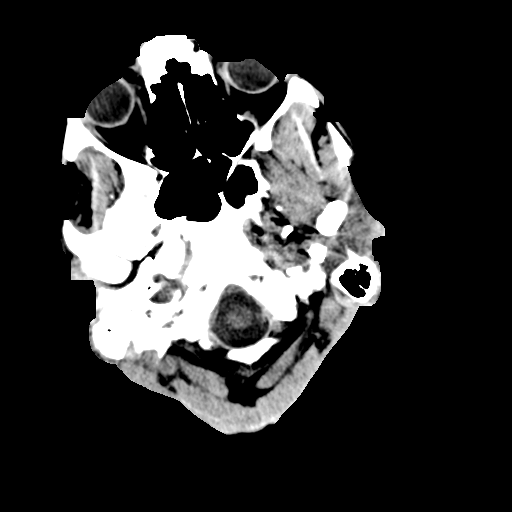
[im 7/36  brain]
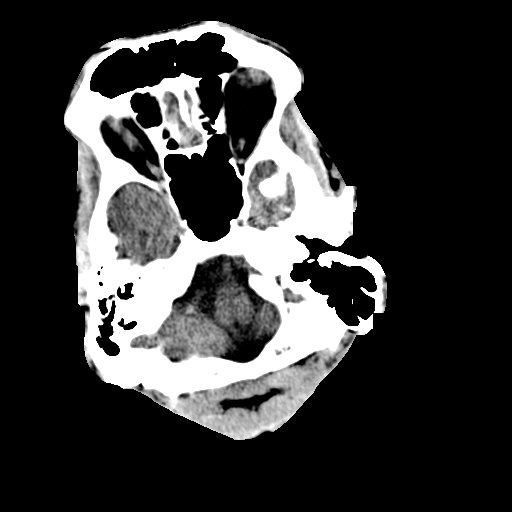
[im 9/36  brain]
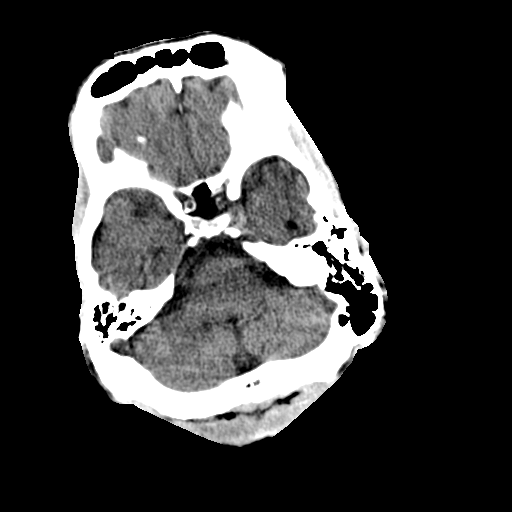
[im 11/36  brain]
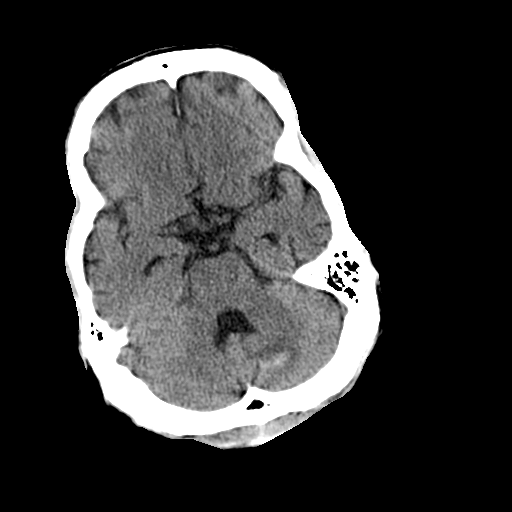
[im 11/36  bone]
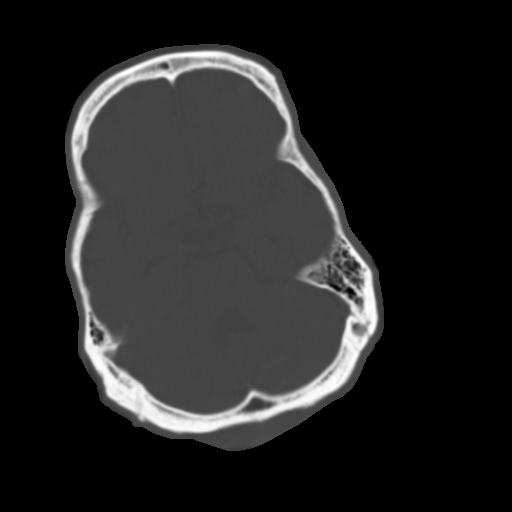
[im 14/36  brain]
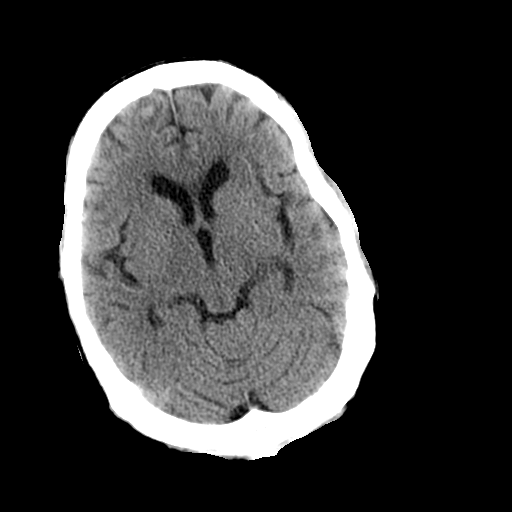
[im 16/36  brain]
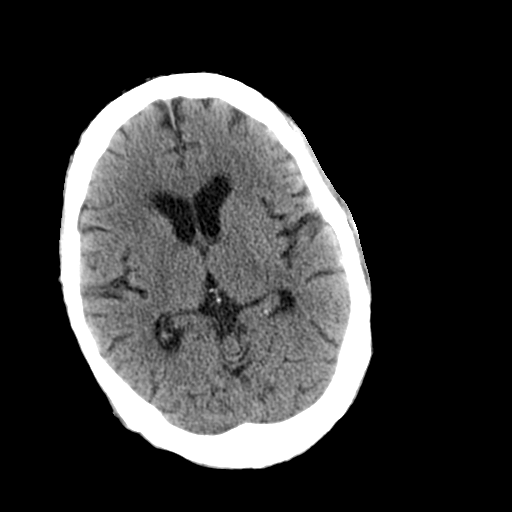
[im 19/36  brain]
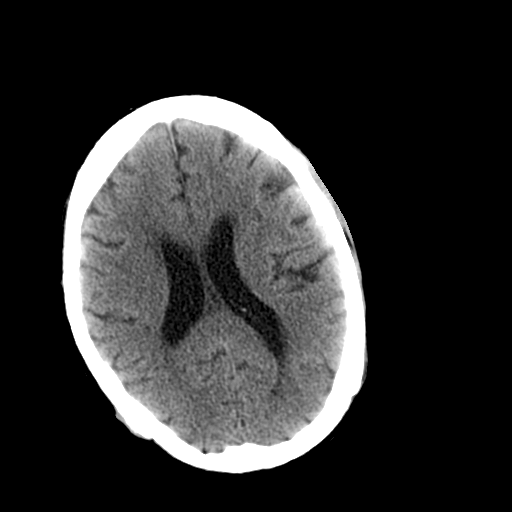
[im 20/36  brain]
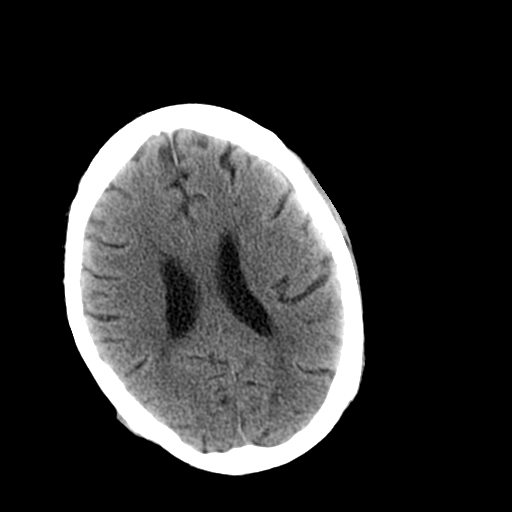
[im 20/36  bone]
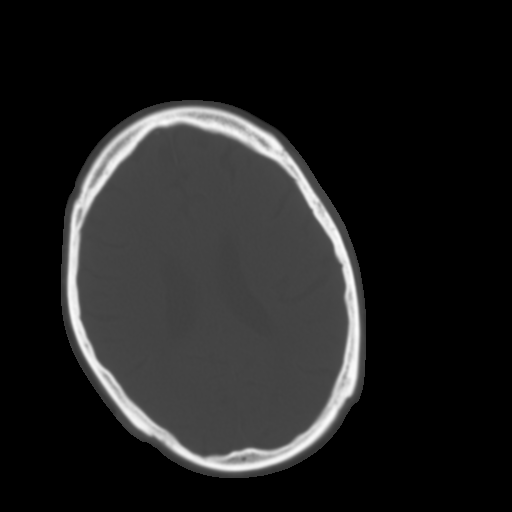
[im 22/36  brain]
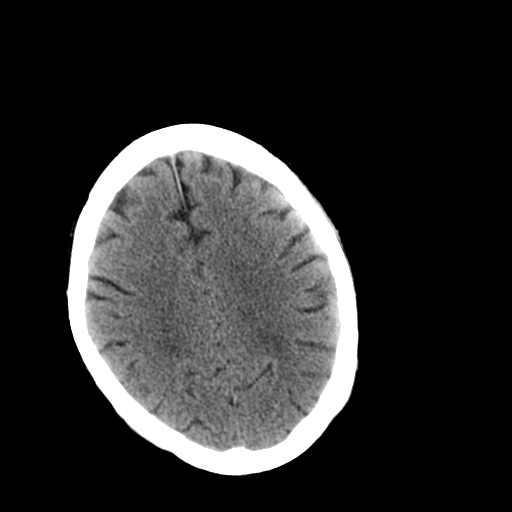
[im 25/36  brain]
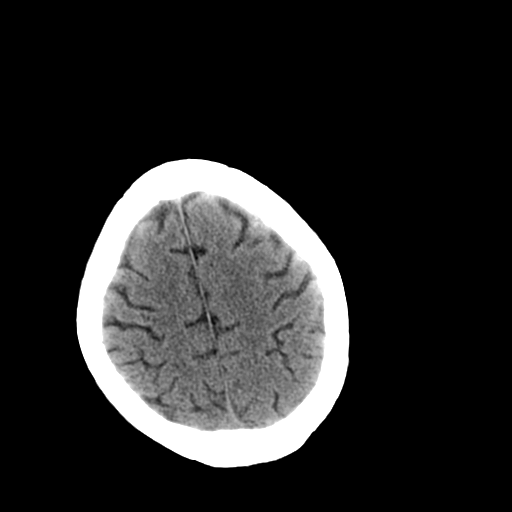
[im 27/36  brain]
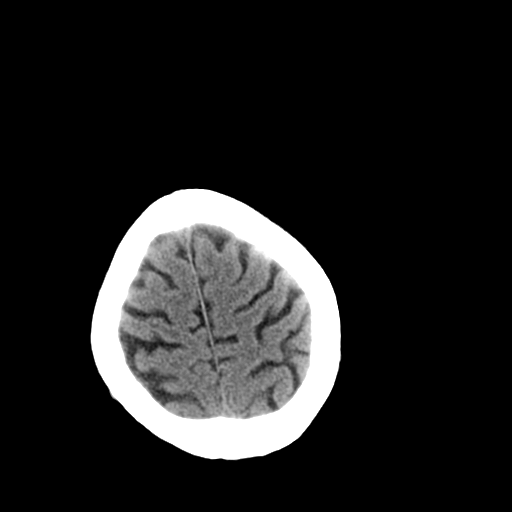
[im 29/36  brain]
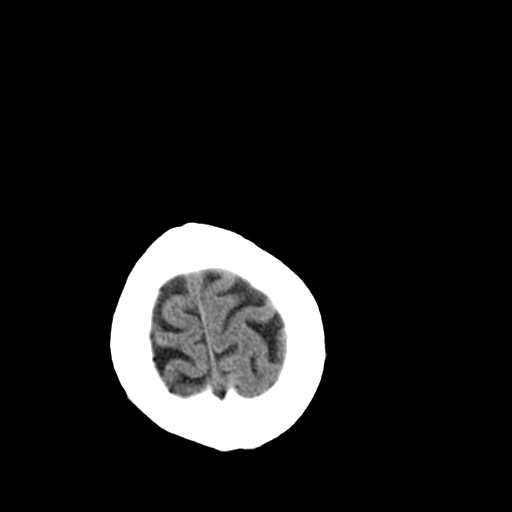
[im 29/36  bone]
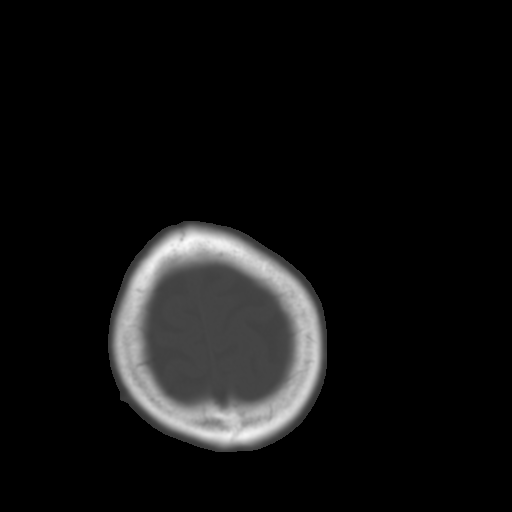
[im 32/36  brain]
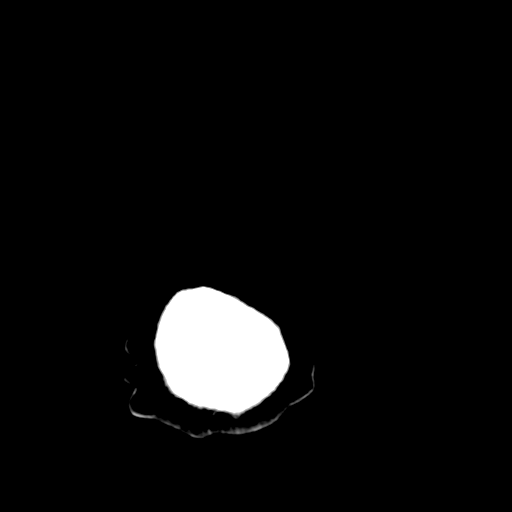
[im 34/36  brain]
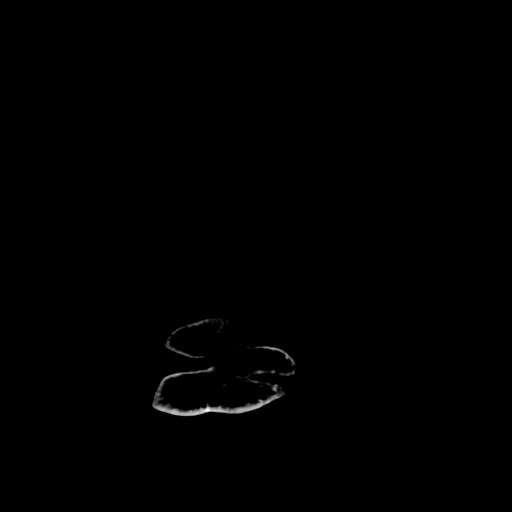

[15 of 30 positions shown; findings below may reference images not displayed]

FINDINGS: There is a 6.7 mm hyperdense focus in the subcortical white matter
of the right frontal lobe which may reflect a hemorrhagic contusion
in the setting of trauma versus a hemorrhagic mass. There is no
significant surrounding vasogenic edema. There is no evidence of
mass effect, midline shift or extra-axial fluid collections. There
is no evidence of a cortical-based area of acute infarction.

The ventricles and sulci are appropriate for the patient's age. The
basal cisterns are patent.

Visualized portions of the orbits are unremarkable. The visualized
portions of the paranasal sinuses and mastoid air cells are
unremarkable.

The osseous structures are unremarkable.
IMPRESSION: There is a 6.7 mm hyperdense focus in the subcortical white matter
of the right frontal lobe which may reflect a hemorrhagic contusion
in the setting of trauma versus a hemorrhagic mass. There is no
significant surrounding vasogenic edema. Recommend further
evaluation with an MRI of the brain without and with intravenous
contrast.
